# Patient Record
Sex: Male | Born: 1935 | Race: Black or African American | Hispanic: No | Marital: Single | Smoking: Never smoker
Health system: Southern US, Community
[De-identification: ages and names within clinical notes are randomized; demographics above are authoritative.]

## PROBLEM LIST (undated history)

## (undated) DIAGNOSIS — F0390 Unspecified dementia without behavioral disturbance: Secondary | ICD-10-CM

## (undated) DIAGNOSIS — E039 Hypothyroidism, unspecified: Secondary | ICD-10-CM

## (undated) DIAGNOSIS — I10 Essential (primary) hypertension: Secondary | ICD-10-CM

## (undated) DIAGNOSIS — I499 Cardiac arrhythmia, unspecified: Secondary | ICD-10-CM

---

## 2021-07-08 ENCOUNTER — Encounter (HOSPITAL_COMMUNITY): Payer: Self-pay | Admitting: Internal Medicine

## 2021-07-08 ENCOUNTER — Emergency Department (HOSPITAL_COMMUNITY): Payer: Medicare Other

## 2021-07-08 ENCOUNTER — Inpatient Hospital Stay (HOSPITAL_COMMUNITY)
Admission: EM | Admit: 2021-07-08 | Discharge: 2021-07-13 | DRG: 566 | Disposition: A | Discharge status: Skilled Nursing Facility | Payer: Medicare Other | Source: Skilled Nursing Facility | Attending: Internal Medicine | Admitting: Internal Medicine

## 2021-07-08 ENCOUNTER — Observation Stay (HOSPITAL_COMMUNITY): Payer: Medicare Other

## 2021-07-08 ENCOUNTER — Other Ambulatory Visit: Payer: Self-pay

## 2021-07-08 DIAGNOSIS — T796XXA Traumatic ischemia of muscle, initial encounter: Principal | ICD-10-CM | POA: Diagnosis present

## 2021-07-08 DIAGNOSIS — M6282 Rhabdomyolysis: Secondary | ICD-10-CM | POA: Diagnosis present

## 2021-07-08 DIAGNOSIS — E039 Hypothyroidism, unspecified: Secondary | ICD-10-CM | POA: Diagnosis present

## 2021-07-08 DIAGNOSIS — F0280 Dementia in other diseases classified elsewhere without behavioral disturbance: Secondary | ICD-10-CM | POA: Diagnosis present

## 2021-07-08 DIAGNOSIS — I10 Essential (primary) hypertension: Secondary | ICD-10-CM | POA: Diagnosis present

## 2021-07-08 DIAGNOSIS — Z66 Do not resuscitate: Secondary | ICD-10-CM | POA: Diagnosis present

## 2021-07-08 DIAGNOSIS — I480 Paroxysmal atrial fibrillation: Secondary | ICD-10-CM | POA: Diagnosis present

## 2021-07-08 DIAGNOSIS — S80821A Blister (nonthermal), right lower leg, initial encounter: Secondary | ICD-10-CM | POA: Diagnosis present

## 2021-07-08 DIAGNOSIS — E785 Hyperlipidemia, unspecified: Secondary | ICD-10-CM | POA: Diagnosis present

## 2021-07-08 DIAGNOSIS — G20 Parkinson's disease: Secondary | ICD-10-CM | POA: Diagnosis present

## 2021-07-08 DIAGNOSIS — Z7401 Bed confinement status: Secondary | ICD-10-CM

## 2021-07-08 DIAGNOSIS — Z993 Dependence on wheelchair: Secondary | ICD-10-CM

## 2021-07-08 DIAGNOSIS — R609 Edema, unspecified: Secondary | ICD-10-CM | POA: Diagnosis present

## 2021-07-08 DIAGNOSIS — Z6834 Body mass index (BMI) 34.0-34.9, adult: Secondary | ICD-10-CM

## 2021-07-08 DIAGNOSIS — W19XXXA Unspecified fall, initial encounter: Secondary | ICD-10-CM

## 2021-07-08 DIAGNOSIS — S80822A Blister (nonthermal), left lower leg, initial encounter: Secondary | ICD-10-CM | POA: Diagnosis present

## 2021-07-08 DIAGNOSIS — F0390 Unspecified dementia without behavioral disturbance: Secondary | ICD-10-CM | POA: Diagnosis present

## 2021-07-08 DIAGNOSIS — Z7982 Long term (current) use of aspirin: Secondary | ICD-10-CM

## 2021-07-08 DIAGNOSIS — Z20822 Contact with and (suspected) exposure to covid-19: Secondary | ICD-10-CM | POA: Diagnosis present

## 2021-07-08 DIAGNOSIS — Z79899 Other long term (current) drug therapy: Secondary | ICD-10-CM

## 2021-07-08 DIAGNOSIS — M160 Bilateral primary osteoarthritis of hip: Secondary | ICD-10-CM | POA: Diagnosis present

## 2021-07-08 DIAGNOSIS — W06XXXA Fall from bed, initial encounter: Secondary | ICD-10-CM | POA: Diagnosis present

## 2021-07-08 DIAGNOSIS — Z7901 Long term (current) use of anticoagulants: Secondary | ICD-10-CM

## 2021-07-08 DIAGNOSIS — G20A1 Parkinson's disease without dyskinesia, without mention of fluctuations: Secondary | ICD-10-CM | POA: Diagnosis present

## 2021-07-08 DIAGNOSIS — F32A Depression, unspecified: Secondary | ICD-10-CM | POA: Diagnosis present

## 2021-07-08 DIAGNOSIS — E669 Obesity, unspecified: Secondary | ICD-10-CM | POA: Diagnosis present

## 2021-07-08 HISTORY — DX: Unspecified dementia, unspecified severity, without behavioral disturbance, psychotic disturbance, mood disturbance, and anxiety: F03.90

## 2021-07-08 HISTORY — DX: Hypothyroidism, unspecified: E03.9

## 2021-07-08 HISTORY — DX: Essential (primary) hypertension: I10

## 2021-07-08 HISTORY — DX: Cardiac arrhythmia, unspecified: I49.9

## 2021-07-08 LAB — COMPREHENSIVE METABOLIC PANEL
ALT: 23 U/L (ref 0–44)
AST: 38 U/L (ref 15–41)
Albumin: 3.3 g/dL — ABNORMAL LOW (ref 3.5–5.0)
Alkaline Phosphatase: 80 U/L (ref 38–126)
Anion gap: 8 (ref 5–15)
BUN: 13 mg/dL (ref 8–23)
CO2: 26 mmol/L (ref 22–32)
Calcium: 9.3 mg/dL (ref 8.9–10.3)
Chloride: 102 mmol/L (ref 98–111)
Creatinine, Ser: 1.23 mg/dL (ref 0.61–1.24)
GFR, Estimated: 59 mL/min — ABNORMAL LOW (ref >60)
Glucose, Bld: 165 mg/dL — ABNORMAL HIGH (ref 70–99)
Potassium: 5 mmol/L (ref 3.5–5.1)
Sodium: 136 mmol/L (ref 135–145)
Total Bilirubin: 0.9 mg/dL (ref 0.3–1.2)
Total Protein: 7.3 g/dL (ref 6.5–8.1)

## 2021-07-08 LAB — CK
Total CK: 1902 U/L — ABNORMAL HIGH (ref 49–397)
Total CK: 5398 U/L — ABNORMAL HIGH (ref 49–397)

## 2021-07-08 LAB — CBC
HCT: 39.8 % (ref 39.0–52.0)
Hemoglobin: 12.3 g/dL — ABNORMAL LOW (ref 13.0–17.0)
MCH: 25.2 pg — ABNORMAL LOW (ref 26.0–34.0)
MCHC: 30.9 g/dL (ref 30.0–36.0)
MCV: 81.6 fL (ref 80.0–100.0)
Platelets: 158 10*3/uL (ref 150–400)
RBC: 4.88 MIL/uL (ref 4.22–5.81)
RDW: 15.1 % (ref 11.5–15.5)
WBC: 10.5 10*3/uL (ref 4.0–10.5)
nRBC: 0 % (ref 0.0–0.2)

## 2021-07-08 LAB — I-STAT CHEM 8, ED
BUN: 17 mg/dL (ref 8–23)
Calcium, Ion: 1.09 mmol/L — ABNORMAL LOW (ref 1.15–1.40)
Chloride: 100 mmol/L (ref 98–111)
Creatinine, Ser: 1.1 mg/dL (ref 0.61–1.24)
Glucose, Bld: 163 mg/dL — ABNORMAL HIGH (ref 70–99)
HCT: 40 % (ref 39.0–52.0)
Hemoglobin: 13.6 g/dL (ref 13.0–17.0)
Potassium: 5 mmol/L (ref 3.5–5.1)
Sodium: 138 mmol/L (ref 135–145)
TCO2: 28 mmol/L (ref 22–32)

## 2021-07-08 LAB — BASIC METABOLIC PANEL
Anion gap: 10 (ref 5–15)
BUN: 12 mg/dL (ref 8–23)
CO2: 26 mmol/L (ref 22–32)
Calcium: 9.1 mg/dL (ref 8.9–10.3)
Chloride: 100 mmol/L (ref 98–111)
Creatinine, Ser: 1.14 mg/dL (ref 0.61–1.24)
GFR, Estimated: >60 mL/min (ref >60)
Glucose, Bld: 137 mg/dL — ABNORMAL HIGH (ref 70–99)
Potassium: 4.3 mmol/L (ref 3.5–5.1)
Sodium: 136 mmol/L (ref 135–145)

## 2021-07-08 LAB — RESP PANEL BY RT-PCR (FLU A&B, COVID) ARPGX2
Influenza A by PCR: NEGATIVE
Influenza B by PCR: NEGATIVE
SARS Coronavirus 2 by RT PCR: NEGATIVE

## 2021-07-08 LAB — MAGNESIUM: Magnesium: 1.9 mg/dL (ref 1.7–2.4)

## 2021-07-08 MED ORDER — LACTATED RINGERS IV SOLN: INTRAVENOUS | Status: AC

## 2021-07-08 MED ORDER — FERROUS SULFATE 300 (60 FE) MG/5ML PO SYRP
330.0000 mg | ORAL_SOLUTION | Freq: Every day | ORAL | Status: DC
  Administered 2021-07-08 – 2021-07-13 (×6): 330 mg via ORAL
  Filled 2021-07-08 (×6): qty 10

## 2021-07-08 MED ORDER — PREDNISOLONE ACETATE 1 % OP SUSP
1.0000 [drp] | Freq: Two times a day (BID) | OPHTHALMIC | Status: DC
  Administered 2021-07-09 – 2021-07-13 (×10): 1 [drp] via OPHTHALMIC
  Filled 2021-07-08: qty 5

## 2021-07-08 MED ORDER — ACETAMINOPHEN 650 MG RE SUPP: 650.0000 mg | Freq: Four times a day (QID) | RECTAL | Status: DC | PRN

## 2021-07-08 MED ORDER — SENNOSIDES-DOCUSATE SODIUM 8.6-50 MG PO TABS: 1.0000 | ORAL_TABLET | Freq: Every evening | ORAL | Status: DC | PRN

## 2021-07-08 MED ORDER — ACETAMINOPHEN 325 MG PO TABS: 650.0000 mg | ORAL_TABLET | Freq: Four times a day (QID) | ORAL | Status: DC | PRN

## 2021-07-08 MED ORDER — ISOSORBIDE MONONITRATE 20 MG PO TABS
10.0000 mg | ORAL_TABLET | Freq: Every day | ORAL | Status: DC
  Administered 2021-07-09 – 2021-07-13 (×5): 10 mg via ORAL
  Filled 2021-07-08 (×5): qty 1

## 2021-07-08 MED ORDER — ESCITALOPRAM OXALATE 10 MG PO TABS
10.0000 mg | ORAL_TABLET | Freq: Every day | ORAL | Status: DC
  Administered 2021-07-09 – 2021-07-13 (×5): 10 mg via ORAL
  Filled 2021-07-08 (×5): qty 1

## 2021-07-08 MED ORDER — LACTATED RINGERS IV BOLUS
250.0000 mL | Freq: Once | INTRAVENOUS | Status: AC
  Administered 2021-07-08: 250 mL via INTRAVENOUS

## 2021-07-08 MED ORDER — OMEGA-3-ACID ETHYL ESTERS 1 G PO CAPS
1.0000 g | ORAL_CAPSULE | Freq: Every day | ORAL | Status: DC
  Administered 2021-07-08 – 2021-07-13 (×6): 1 g via ORAL
  Filled 2021-07-08 (×6): qty 1

## 2021-07-08 MED ORDER — AMANTADINE HCL 100 MG PO CAPS
100.0000 mg | ORAL_CAPSULE | Freq: Two times a day (BID) | ORAL | Status: DC
  Administered 2021-07-08 – 2021-07-13 (×10): 100 mg via ORAL
  Filled 2021-07-08 (×11): qty 1

## 2021-07-08 MED ORDER — DOCUSATE SODIUM 100 MG PO CAPS
100.0000 mg | ORAL_CAPSULE | Freq: Every day | ORAL | Status: DC
  Administered 2021-07-08 – 2021-07-12 (×5): 100 mg via ORAL
  Filled 2021-07-08 (×5): qty 1

## 2021-07-08 MED ORDER — SODIUM CHLORIDE 0.9 % IV BOLUS
1000.0000 mL | Freq: Once | INTRAVENOUS | Status: AC
  Administered 2021-07-08: 1000 mL via INTRAVENOUS

## 2021-07-08 MED ORDER — APIXABAN 5 MG PO TABS
5.0000 mg | ORAL_TABLET | Freq: Two times a day (BID) | ORAL | Status: DC
  Administered 2021-07-08 – 2021-07-13 (×10): 5 mg via ORAL
  Filled 2021-07-08 (×10): qty 1

## 2021-07-08 MED ORDER — LEVOTHYROXINE SODIUM 88 MCG PO TABS
88.0000 ug | ORAL_TABLET | Freq: Every day | ORAL | Status: DC
  Administered 2021-07-09 – 2021-07-13 (×5): 88 ug via ORAL
  Filled 2021-07-08 (×6): qty 1

## 2021-07-08 MED ORDER — NITROGLYCERIN 0.4 MG SL SUBL: 0.4000 mg | SUBLINGUAL_TABLET | SUBLINGUAL | Status: DC | PRN

## 2021-07-08 MED ORDER — CARBIDOPA-LEVODOPA 25-100 MG PO TABS
1.0000 | ORAL_TABLET | Freq: Three times a day (TID) | ORAL | Status: DC
  Administered 2021-07-08 – 2021-07-13 (×15): 1 via ORAL
  Filled 2021-07-08 (×15): qty 1

## 2021-07-08 NOTE — ED Provider Notes (Signed)
Care assumed from Dr. Durwin Nora.  At time of transfer care, patient is awaiting for urinalysis to be completed prior to admission for unwitnessed fall, unknown downtime, unresponsiveness, altered mental status, and rhabdo.  Per previous team, patient will need admission for rhabdo and rehydration.  COVID and flu are negative.  CT of the head showed no acute intracranial normality.  Pelvis and hip imaging showed no acute fractures patient was given a small amount of fluids initially due to the rhabdo but also peripheral edema being present.  Plan of care will be to admit when work-up is completed.  As the patient has been here for  multiple hours waiting evaluation, CK and metabolic panel will be trended to make sure it is not worsening.  7:00 PM Patient CK has gone from 1900 to 5300.  As with previous plan, will order more fluids and call for admission for altered mental status and rhabdo.       Danny Woodard, Canary Brim, MD 07/09/21 [phone]
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
## 2021-07-08 NOTE — H&P (Signed)
History and Physical    Danny Woodard IWL:[id number] DOB: [birth year] DOA: 07/08/2021  PCP: Pcp, No  Patient coming from: Assisted living facility.  History obtained from patient's wife.  Patient has dementia.  Chief Complaint: Unwitnessed fall.  HPI: Danny Woodard is a 85 y.o. male with history of Parkinson's disease and dementia and largely bedbound for the last year was found on the floor this morning after an unwitnessed fall.  Was not known how long patient was on the floor.  Patient was brought to the ER.  Per wife patient has not had any complaints recently.  ED Course: In the ER patient appears nonfocal but has significant pain on moving the lower extremity particular the right 1 and also has blisters on the lower extremities.  Per wife these blisters are new.  Patient's labs show a CK level of 1900 which has further worsened to 5300.  Patient was given fluid bolus admitted for rhabdomyolysis.  EKG shows sinus tachycardia.  COVID test was negative.  X-ray pelvis was unremarkable.  Review of Systems: As per HPI, rest all negative.   Past Medical History:  Diagnosis Date   Dementia (HCC)    Dysrhythmia    Hypertension    Hypothyroidism     History reviewed. No pertinent surgical history.   reports that he has never smoked. He has never used smokeless tobacco. He reports that he does not currently use alcohol. He reports that he does not use drugs.  No Known Allergies  Family History  Problem Relation Age of Onset   Dementia Sister     Prior to Admission medications   Medication Sig Start Date End Date Taking? Authorizing Provider  acetaminophen (TYLENOL) 500 MG tablet Take 1,000 mg by mouth every 6 (six) hours as needed for mild pain or fever.   Yes [provider]  alum & mag hydroxide-simeth (MAALOX/MYLANTA) 200-200-20 MG/5ML suspension Take 30 mLs by mouth every 6 (six) hours as needed for indigestion or heartburn.   Yes [provider]   amantadine (SYMMETREL) 100 MG capsule Take 100 mg by mouth 2 (two) times daily. 06/25/21  Yes [provider]  ASPIRIN LOW DOSE 81 MG chewable tablet Chew 81 mg by mouth daily. 06/25/21  Yes [provider]  carbidopa-levodopa (SINEMET IR) 25-100 MG tablet Take 1 tablet by mouth 3 (three) times daily. 06/25/21  Yes [provider]  Cholecalciferol (VITAMIN D-3) 125 MCG (5000 UT) TABS Take 125 mcg by mouth daily.   Yes [provider]  Cyanocobalamin (VITAMIN B12) 1000 MCG TBCR Take 1,000 mg by mouth daily.   Yes [provider]  docusate sodium (COLACE) 100 MG capsule Take 100 mg by mouth at bedtime.   Yes [provider]  ELIQUIS 5 MG TABS tablet Take 5 mg by mouth 2 (two) times daily. 06/25/21  Yes [provider]  escitalopram (LEXAPRO) 10 MG tablet Take 10 mg by mouth daily. 06/25/21  Yes [provider]  ferrous sulfate 220 (44 Fe) MG/5ML solution Take 330 mg by mouth daily.   Yes [provider]  isosorbide mononitrate (ISMO) 10 MG tablet Take 10 mg by mouth daily. 06/25/21  Yes [provider]  levothyroxine (SYNTHROID) 88 MCG tablet Take 88 mcg by mouth daily. 06/25/21  Yes [provider]  Menthol-Zinc Oxide (CALMOSEPTINE) 0.44-20.6 % OINT Apply 1 application topically as needed (to buttocks).   Yes [provider]  nitroGLYCERIN (NITROSTAT) 0.4 MG SL tablet Place 0.4 mg  under the tongue every 5 (five) minutes as needed for chest pain. 06/25/21  Yes [provider]  nystatin (MYCOSTATIN/NYSTOP) powder Apply 1 application topically at bedtime. And as needed for rash/irritation 06/25/21  Yes [provider]  omega-3 acid ethyl esters (LOVAZA) 1 g capsule Take 1 g by mouth daily. 06/25/21  Yes [provider]  potassium chloride (KLOR-CON) 20 MEQ packet Take 20 mEq by mouth daily. 06/25/21  Yes [provider]  prednisoLONE acetate (PRED FORTE) 1 % ophthalmic suspension  Place 1 drop into both eyes in the morning and at bedtime. 06/25/21  Yes [provider]  rosuvastatin (CRESTOR) 40 MG tablet Take 40 mg by mouth at bedtime. 06/25/21  Yes [provider]  senna-docusate (SENOKOT-S) 8.6-50 MG tablet Take 1 tablet by mouth at bedtime as needed for mild constipation.   Yes [provider]    Physical Exam: Constitutional: Moderately built and nourished. Vitals:   07/08/21 1900 07/08/21 1915 07/08/21 1937 07/08/21 1938  BP: 107/76 130/63  (!) 119/59  Pulse: 92 92  96  Resp: (!) 22 (!) 24  20  Temp:    99.2 F (37.3 C)  TempSrc:    Oral  SpO2: 96% 95%    Weight:   106.1 kg   Height:   6\' 4"  (1.93 m)    Eyes: Anicteric no pallor. ENMT: No discharge from the ears eyes nose and mouth. Neck: No mass felt.  No neck rigidity. Respiratory: No rhonchi or crepitations. Cardiovascular: S1-S2 heard. Abdomen: Soft nontender bowel sound present. Musculoskeletal: Swelling of the both lower extremities.  Has more pain on the right lower extremity when he moves. Skin: Blisters on both lower extremities.  More on the right side. Neurologic: Alert awake oriented to his name.  Moving all extremities but lower extremities restricted with pain. Psychiatric: Has dementia.   Labs on Admission: I have personally reviewed following labs and imaging studies  CBC: Recent Labs  Lab 07/08/21 0835 07/08/21 0915  WBC 10.5  --   HGB 12.3* 13.6  HCT 39.8 40.0  MCV 81.6  --   PLT 158  --    Basic Metabolic Panel: Recent Labs  Lab 07/08/21 0835 07/08/21 0915 07/08/21 1556  NA 136 138 136  K 5.0 5.0 4.3  CL 102 100 100  CO2 26  --  26  GLUCOSE 165* 163* 137*  BUN 13 17 12   CREATININE 1.23 1.10 1.14  CALCIUM 9.3  --  9.1  MG 1.9  --   --    GFR: Estimated Creatinine Clearance: 66.8 mL/min (by C-G formula based on SCr of 1.14 mg/dL). Liver Function Tests: Recent Labs  Lab 07/08/21 0835  AST 38  ALT 23  ALKPHOS 80  BILITOT 0.9  PROT  7.3  ALBUMIN 3.3*   No results for input(s): LIPASE, AMYLASE in the last 168 hours. No results for input(s): AMMONIA in the last 168 hours. Coagulation Profile: No results for input(s): INR, PROTIME in the last 168 hours. Cardiac Enzymes: Recent Labs  Lab 07/08/21 0835 07/08/21 1556  CKTOTAL 1,902* 5,398*   BNP (last 3 results) No results for input(s): PROBNP in the last 8760 hours. HbA1C: No results for input(s): HGBA1C in the last 72 hours. CBG: No results for input(s): GLUCAP in the last 168 hours. Lipid Profile: No results for input(s): CHOL, HDL, LDLCALC, TRIG, CHOLHDL, LDLDIRECT in the last 72 hours. Thyroid Function Tests: No results for input(s): TSH, T4TOTAL, FREET4, T3FREE, THYROIDAB in the  last 72 hours. Anemia Panel: No results for input(s): VITAMINB12, FOLATE, FERRITIN, TIBC, IRON, RETICCTPCT in the last 72 hours. Urine analysis: No results found for: COLORURINE, APPEARANCEUR, LABSPEC, PHURINE, GLUCOSEU, HGBUR, BILIRUBINUR, KETONESUR, PROTEINUR, UROBILINOGEN, NITRITE, LEUKOCYTESUR Sepsis Labs: @LABRCNTIP (procalcitonin:4,lacticidven:4) ) Recent Results (from the past 240 hour(s))  Resp Panel by RT-PCR (Flu A&B, Covid) Nasopharyngeal Swab     Status: None   Collection Time: 07/08/21  9:21 AM   Specimen: Nasopharyngeal Swab; Nasopharyngeal(NP) swabs in vial transport medium  Result Value Ref Range Status   SARS Coronavirus 2 by RT PCR NEGATIVE NEGATIVE Final    Comment: (NOTE) SARS-CoV-2 target nucleic acids are NOT DETECTED.  The SARS-CoV-2 RNA is generally detectable in upper respiratory specimens during the acute phase of infection. The lowest concentration of SARS-CoV-2 viral copies this assay can detect is 138 copies/mL. A negative result does not preclude SARS-Cov-2 infection and should not be used as the sole basis for treatment or other patient management decisions. A negative result may occur with  improper specimen collection/handling, submission of  specimen other than nasopharyngeal swab, presence of viral mutation(s) within the areas targeted by this assay, and inadequate number of viral copies(<138 copies/mL). A negative result must be combined with clinical observations, patient history, and epidemiological information. The expected result is Negative.  Fact Sheet for Patients:  07/10/21  Fact Sheet for Healthcare Providers:  BloggerCourse.com  This test is no t yet approved or cleared by the [employer] FDA and  has been authorized for detection and/or diagnosis of SARS-CoV-2 by FDA under an Emergency Use Authorization (EUA). This EUA will remain  in effect (meaning this test can be used) for the duration of the COVID-19 declaration under Section 564(b)(1) of the Act, 21 U.S.C.section 360bbb-3(b)(1), unless the authorization is terminated  or revoked sooner.       Influenza A by PCR NEGATIVE NEGATIVE Final   Influenza B by PCR NEGATIVE NEGATIVE Final    Comment: (NOTE) The Xpert Xpress SARS-CoV-2/FLU/RSV plus assay is intended as an aid in the diagnosis of influenza from Nasopharyngeal swab specimens and should not be used as a sole basis for treatment. Nasal washings and aspirates are unacceptable for Xpert Xpress SARS-CoV-2/FLU/RSV testing.  Fact Sheet for Patients: [place]  Fact Sheet for Healthcare Providers: BloggerCourse.com  This test is not yet approved or cleared by the [employer] FDA and has been authorized for detection and/or diagnosis of SARS-CoV-2 by FDA under an Emergency Use Authorization (EUA). This EUA will remain in effect (meaning this test can be used) for the duration of the COVID-19 declaration under Section 564(b)(1) of the Act, 21 U.S.C. section 360bbb-3(b)(1), unless the authorization is terminated or revoked.  Performed at [facility], [address],  [place], [place]      Radiological Exams on Admission: CT HEAD WO CONTRAST (41287)  Result Date: 07/08/2021 CLINICAL DATA:  Trauma EXAM: CT HEAD WITHOUT CONTRAST TECHNIQUE: Contiguous axial images were obtained from the base of the skull through the vertex without intravenous contrast. COMPARISON:  None. FINDINGS: Brain: There is no evidence of acute intracranial hemorrhage, extra-axial fluid collection, or infarct. There is encephalomalacia in the left occipital lobe and right parietal lobe consistent with remote infarcts. There are remote lacunar infarcts in the bilateral basal ganglia. Additional hypodensities throughout the subcortical and periventricular white matter likely reflects sequela of chronic white matter microangiopathy. There is mild global parenchymal volume loss with ex vacuo enlargement of the ventricular system. No mass lesion is  identified. There is no midline shift. Vascular: There is calcification of the bilateral cavernous ICAs and vertebral arteries. Skull: Normal. Negative for fracture or focal lesion. Sinuses/Orbits: There is mild mucosal thickening along the floor the right maxillary sinus. The remaining paranasal sinuses are clear. Bilateral lens implants are in place. The globes and orbits are otherwise unremarkable. Other: None. IMPRESSION: 1. No acute intracranial hemorrhage or calvarial fracture. 2. 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 Electronically Signed   By: Lesia Hausen M.D.   On: 07/08/2021 10:23   DG Hip Unilat W or Wo Pelvis 2-3 Views Left  Result Date: 07/08/2021 CLINICAL DATA:  Larey Seat 2 days ago. EXAM: DG HIP (WITH OR WITHOUT PELVIS) 2-3V RIGHT; DG HIP (WITH OR WITHOUT PELVIS) 2-3V LEFT COMPARISON:  None. FINDINGS: No acute fracture or dislocation. Mild bilateral hip osteoarthritis. IMPRESSION: 1. No acute osseous abnormality. Electronically Signed   By:  Obie Dredge M.D.   On: 07/08/2021 10:07   DG Hip Unilat W or Wo Pelvis 2-3 Views Right  Result Date: 07/08/2021 CLINICAL DATA:  Larey Seat 2 days ago. EXAM: DG HIP (WITH OR WITHOUT PELVIS) 2-3V RIGHT; DG HIP (WITH OR WITHOUT PELVIS) 2-3V LEFT COMPARISON:  None. FINDINGS: No acute fracture or dislocation. Mild bilateral hip osteoarthritis. IMPRESSION: 1. No acute osseous abnormality. Electronically Signed   By: Obie Dredge M.D.   On: 07/08/2021 10:07    EKG: Independently reviewed.  Sinus tachycardia RBBB.  Assessment/Plan Principal Problem:   Rhabdomyolysis Active Problems:   PAF (paroxysmal atrial fibrillation) (HCC)   Parkinson's disease (HCC)   Dementia (HCC)   Hypothyroidism    Rhabdomyolysis secondary to fall for which patient is receiving fluids.  Since patient has significant pain on moving his lower extremity particular the right 1 I have ordered a CT of the right femur.  We will continue to monitor CK levels.  Follow intake output. Blisters on the lower extremities more on the right side.  Cause not clear.  We will closely monitor.  Wound team consult. A. fib presently slightly tachycardic.  On Eliquis.  At home patient is not on any rate limiting medications. History of Parkinson's disease on carbidopa and amantadine. History of depression on Lexapro. Hypertension on isosorbide. Hyperlipidemia on statins holding secondary to rhabdomyolysis. Hypothyroidism on Synthroid.  Check TSH. Dementia presently not on medications.   DVT prophylaxis: Apixaban. Code Status: DNR confirmed with patient's wife. Family Communication: Patient's wife. Disposition Plan: Back to facility when stable. Consults called: Wound team.  Physical therapy. Admission status: Observation.   Eduard Clos MD [facility] Pager [phone].  If 7PM-7AM, please contact night-coverage [email or web address] Password [facility]  07/08/2021, 9:14 PM

## 2021-07-08 NOTE — ED Triage Notes (Signed)
Pt arrived to ED via [facility] from [facility] w/ c/o unwitnessed fall after rolling out of bed. Pt c/o L hip pain 7/10. No deformities noted by PTAR noted fluid filled blisters and BLE edema.  PTAR VS: 140/102, HR 103, 91% RA (PTAR didn't apply oxygen)

## 2021-07-09 DIAGNOSIS — S80822A Blister (nonthermal), left lower leg, initial encounter: Secondary | ICD-10-CM | POA: Diagnosis present

## 2021-07-09 DIAGNOSIS — I10 Essential (primary) hypertension: Secondary | ICD-10-CM | POA: Diagnosis present

## 2021-07-09 DIAGNOSIS — Z6834 Body mass index (BMI) 34.0-34.9, adult: Secondary | ICD-10-CM | POA: Diagnosis not present

## 2021-07-09 DIAGNOSIS — I480 Paroxysmal atrial fibrillation: Secondary | ICD-10-CM | POA: Diagnosis present

## 2021-07-09 DIAGNOSIS — E785 Hyperlipidemia, unspecified: Secondary | ICD-10-CM | POA: Diagnosis present

## 2021-07-09 DIAGNOSIS — E669 Obesity, unspecified: Secondary | ICD-10-CM | POA: Diagnosis present

## 2021-07-09 DIAGNOSIS — W06XXXA Fall from bed, initial encounter: Secondary | ICD-10-CM | POA: Diagnosis present

## 2021-07-09 DIAGNOSIS — E039 Hypothyroidism, unspecified: Secondary | ICD-10-CM

## 2021-07-09 DIAGNOSIS — S80821A Blister (nonthermal), right lower leg, initial encounter: Secondary | ICD-10-CM | POA: Diagnosis present

## 2021-07-09 DIAGNOSIS — T796XXA Traumatic ischemia of muscle, initial encounter: Secondary | ICD-10-CM | POA: Diagnosis present

## 2021-07-09 DIAGNOSIS — G309 Alzheimer's disease, unspecified: Secondary | ICD-10-CM

## 2021-07-09 DIAGNOSIS — Z79899 Other long term (current) drug therapy: Secondary | ICD-10-CM | POA: Diagnosis not present

## 2021-07-09 DIAGNOSIS — Z993 Dependence on wheelchair: Secondary | ICD-10-CM | POA: Diagnosis not present

## 2021-07-09 DIAGNOSIS — R609 Edema, unspecified: Secondary | ICD-10-CM | POA: Diagnosis present

## 2021-07-09 DIAGNOSIS — Z7401 Bed confinement status: Secondary | ICD-10-CM | POA: Diagnosis not present

## 2021-07-09 DIAGNOSIS — F0280 Dementia in other diseases classified elsewhere without behavioral disturbance: Secondary | ICD-10-CM

## 2021-07-09 DIAGNOSIS — Z7982 Long term (current) use of aspirin: Secondary | ICD-10-CM | POA: Diagnosis not present

## 2021-07-09 DIAGNOSIS — Z20822 Contact with and (suspected) exposure to covid-19: Secondary | ICD-10-CM | POA: Diagnosis present

## 2021-07-09 DIAGNOSIS — Z66 Do not resuscitate: Secondary | ICD-10-CM | POA: Diagnosis present

## 2021-07-09 DIAGNOSIS — M6282 Rhabdomyolysis: Secondary | ICD-10-CM | POA: Diagnosis present

## 2021-07-09 DIAGNOSIS — W19XXXA Unspecified fall, initial encounter: Secondary | ICD-10-CM | POA: Diagnosis not present

## 2021-07-09 DIAGNOSIS — F32A Depression, unspecified: Secondary | ICD-10-CM | POA: Diagnosis present

## 2021-07-09 DIAGNOSIS — M160 Bilateral primary osteoarthritis of hip: Secondary | ICD-10-CM | POA: Diagnosis present

## 2021-07-09 DIAGNOSIS — Z7901 Long term (current) use of anticoagulants: Secondary | ICD-10-CM | POA: Diagnosis not present

## 2021-07-09 DIAGNOSIS — G20 Parkinson's disease: Secondary | ICD-10-CM | POA: Diagnosis present

## 2021-07-09 LAB — HEPATIC FUNCTION PANEL
ALT: 29 U/L (ref 0–44)
AST: 96 U/L — ABNORMAL HIGH (ref 15–41)
Albumin: 3 g/dL — ABNORMAL LOW (ref 3.5–5.0)
Alkaline Phosphatase: 75 U/L (ref 38–126)
Bilirubin, Direct: 0.2 mg/dL (ref 0.0–0.2)
Indirect Bilirubin: 0.8 mg/dL (ref 0.3–0.9)
Total Bilirubin: 1 mg/dL (ref 0.3–1.2)
Total Protein: 6.6 g/dL (ref 6.5–8.1)

## 2021-07-09 LAB — BASIC METABOLIC PANEL
Anion gap: 11 (ref 5–15)
BUN: 12 mg/dL (ref 8–23)
CO2: 23 mmol/L (ref 22–32)
Calcium: 8.7 mg/dL — ABNORMAL LOW (ref 8.9–10.3)
Chloride: 102 mmol/L (ref 98–111)
Creatinine, Ser: 1.04 mg/dL (ref 0.61–1.24)
GFR, Estimated: >60 mL/min (ref >60)
Glucose, Bld: 115 mg/dL — ABNORMAL HIGH (ref 70–99)
Potassium: 4.3 mmol/L (ref 3.5–5.1)
Sodium: 136 mmol/L (ref 135–145)

## 2021-07-09 LAB — CBC
HCT: 37.1 % — ABNORMAL LOW (ref 39.0–52.0)
Hemoglobin: 11.5 g/dL — ABNORMAL LOW (ref 13.0–17.0)
MCH: 25.7 pg — ABNORMAL LOW (ref 26.0–34.0)
MCHC: 31 g/dL (ref 30.0–36.0)
MCV: 83 fL (ref 80.0–100.0)
Platelets: 135 10*3/uL — ABNORMAL LOW (ref 150–400)
RBC: 4.47 MIL/uL (ref 4.22–5.81)
RDW: 15.3 % (ref 11.5–15.5)
WBC: 8.9 10*3/uL (ref 4.0–10.5)
nRBC: 0.2 % (ref 0.0–0.2)

## 2021-07-09 LAB — CK: Total CK: 6095 U/L — ABNORMAL HIGH (ref 49–397)

## 2021-07-09 NOTE — ED Notes (Addendum)
Attempted to give to floor [phone]

## 2021-07-09 NOTE — Progress Notes (Signed)
Physical Therapy Evaluation Patient Details Name: Danny Woodard MRN: [id number] DOB: 12-31-1935 Today's Date: 07/09/2021   History of Present Illness  Pt is a 84yo male presenting to  [facility] ED on 8/18 from facility s/p fall out of bed. Pt down for approximately 1 hour and labwork suggestive of rhabdomyolysis. Imaging negative for acute findings. PMH: dementia, Parkinson's Disease, PAF, hypothyroidism, HTN.  Clinical Impression  Pt presents with the impairments above and problems listed below. Pt was total assist +2 for rolling in bed multiple times for cleanup following BM. Pt demonstrating high level of rigidity and had difficulty with basic bed mobility tasks. Further mobility unsafe to attempt on stretcher. Recommending SNF-level therapies upon discharge. We will continue to follow him acutely to promote independence with functional mobility.    Follow Up Recommendations SNF;Supervision/Assistance - 24 hour    Equipment Recommendations  None recommended by PT (Defer to next level of care)    Recommendations for Other Services       Precautions / Restrictions Precautions Precautions: Fall Precaution Comments: Larey Seat out of bed at facility. Restrictions Weight Bearing Restrictions: No      Mobility  Bed Mobility Overal bed mobility: Needs Assistance Bed Mobility: Rolling Rolling: +2 for physical assistance;Total assist         General bed mobility comments: Mobility limited to bed mobility as pt with BM upon entry. Pt required total assist +2 for rolling in bed multiple times for clean up; noted increased rigidity and weakness. Able to assist very minimally with UEs. Further mobility unsafe to attempt on stretcher    Transfers                    Ambulation/Gait                Stairs            Wheelchair Mobility    Modified Rankin (Stroke Patients Only)       Balance                                             Pertinent  Vitals/Pain Pain Assessment: 0-10 Pain Score: 9  Pain Location: right hip Pain Descriptors / Indicators: Grimacing;Guarding Pain Intervention(s): Monitored during session;Repositioned    Home Living Family/patient expects to be discharged to:: Other (Comment) ([facility]: likely [facility] care)                      Prior Function Level of Independence: Needs assistance   Gait / Transfers Assistance Needed: per chart, pt transfers from bed to [facility], but assume pt requires assist.  ADL's / Homemaking Assistance Needed: Unsure of baseline, but anticipate pt requires assist for all ADLs.        Hand Dominance        Extremity/Trunk Assessment   Upper Extremity Assessment Upper Extremity Assessment: Generalized weakness    Lower Extremity Assessment Lower Extremity Assessment: Generalized weakness;RLE deficits/detail RLE Deficits / Details: Large blisters noted throughout RLE       Communication   Communication: HOH  Cognition Arousal/Alertness: Awake/alert Behavior During Therapy: Flat affect Overall Cognitive Status: History of cognitive impairments - at baseline  General Comments: Pt alert to self only, able to respond to simple questions and commands.      General Comments General comments (skin integrity, edema, etc.): Noted blistering of skin on LEs with edematous nature of BLE. Right LE warmer to touch than left.    Exercises     Assessment/Plan    PT Assessment Patient needs continued PT services  PT Problem List Decreased strength;Decreased range of motion;Decreased activity tolerance;Decreased balance;Decreased mobility;Decreased coordination;Decreased cognition;Decreased safety awareness;Pain;Decreased knowledge of precautions;Decreased knowledge of use of DME       PT Treatment Interventions DME instruction;Functional mobility training;Therapeutic activities;Therapeutic exercise;Balance  training;Neuromuscular re-education;Cognitive remediation;Patient/family education;Wheelchair mobility training    PT Goals (Current goals can be found in the Care Plan section)  Acute Rehab PT Goals PT Goal Formulation: Patient unable to participate in goal setting Time For Goal Achievement: 07/23/21 Potential to Achieve Goals: Fair    Frequency Min 2X/week   Barriers to discharge        Co-evaluation               AM-PAC PT "6 Clicks" Mobility  Outcome Measure Help needed turning from your back to your side while in a flat bed without using bedrails?: Total Help needed moving from lying on your back to sitting on the side of a flat bed without using bedrails?: Total Help needed moving to and from a bed to a chair (including a wheelchair)?: Total Help needed standing up from a chair using your arms (e.g., wheelchair or bedside chair)?: Total Help needed to walk in hospital room?: Total Help needed climbing 3-5 steps with a railing? : Total 6 Click Score: 6    End of Session   Activity Tolerance: Patient tolerated treatment well Patient left: in bed;with call bell/phone within reach (on stretcher in ED) Nurse Communication: Mobility status;Other (comment) (Pt had BM) PT Visit Diagnosis: Other symptoms and signs involving the nervous system (R29.898);Muscle weakness (generalized) (M62.81);History of falling (Z91.81)    Time: 9539-6728 PT Time Calculation (min) (ACUTE ONLY): 28 min   Charges:   PT Evaluation $PT Eval Moderate Complexity: 1 Mod PT Treatments $Therapeutic Activity: 8-22 mins       Johnn Hai, SPT Johnn Hai 07/09/2021, 2:30 PM

## 2021-07-09 NOTE — Progress Notes (Addendum)
PROGRESS NOTE        PATIENT DETAILS Name: Danny Woodard Age: 85 y.o. Sex: male Date of Birth: 07-29-1936 Admit Date: 07/08/2021 Admitting Physician Eduard Clos, MD PCP:Pcp, No  Brief Narrative: Patient is a 85 y.o. male with history of Parkinson's disease, dementia, mostly bed to wheelchair bound (able to transfer) who sustained a mechanical fall (while transferring from wheelchair to bed) and was found on the floor by his nursing facility staff.  Per patient report he was on the floor for more than an hour before he was found.  He was subsequently admitted to the hospitalist service.  Significant events: 8/18>> admit for mechanical fall-rhabdomyolysis.  Significant studies: 8/18>> CT head: No acute intracranial abnormalities 8/18>> x-ray right/left hip: No acute osseous abnormality. 8/18>> CT right femur: No fracture.  Antimicrobial therapy: None  Microbiology data: 8/18>> COVID/influenza PCR: Negative  Procedures : None  Consults: None  DVT Prophylaxis : apixaban (ELIQUIS) tablet 5 mg    Subjective: Lying comfortably in bed-slow to respond but does respond appropriately to simple questions.  Speech slow but clear.  Claims he was on the floor for at least an hour before he was found by nursing staff.  Claims that he fell when he was transferring from wheelchair to bed.  Denies loss of consciousness.   Assessment/Plan: Rhabdomyolysis: Due to fall/being on the floor for more than an hour.  CK levels increasing this morning.  Continue IVF-recheck CK levels tomorrow.   Mechanical fall: Mostly bed to wheelchair bound-but able to transfer-minimally ambulate.  CT imaging negative for fractures.  Await PT/OT eval to determine safe disposition.  Suspect this fall is due to frailty/chronic debility/Parkinson's disease associated autonomic dysfunction.  PAF: Maintaining sinus rhythm-continue Eliquis.  HTN: Stable-continue  Imdur  Parkinson's disease: Continue carbidopa/levodopa/amantadine.  Hypothyroidism: Continue Synthroid  Dementia: Unclear whether this is Alzheimer's dementia or dementia related to Parkinson's disease.  Seems minimally confused-family not present-not clear whether he is at baseline.  Depression: Stable-continue Lexapro  Right lower extremity blisters: Per patient-this occurred after he fell-suspect this is due to edema from falling and lying mostly on his right side.  Wound care consult appreciated.  Diet: Diet Order             Diet Heart Room service appropriate? Yes; Fluid consistency: Thin  Diet effective now                    Code Status: DNR  Family Communication: [phone] left a voicemail on 8/19  Disposition Plan: Status is: Observation  The patient will require care spanning > 2 midnights and should be moved to inpatient because: Inpatient level of care appropriate due to severity of illness  Dispo: The patient is from: ALF              Anticipated d/c is to: SNF vs back to ALF              Patient currently is not medically stable to d/c.   Difficult to place patient No    Barriers to Discharge: Worsening rhabdo with increasing CK levels-continue IV fluids.  Await PT evaluation to ensure he is safe to go back to ALF.  Antimicrobial agents: Anti-infectives (From admission, onward)    None        Time spent: 35 minutes-Greater than 50% of this time  was spent in counseling, explanation of diagnosis, planning of further management, and coordination of care.  MEDICATIONS: Scheduled Meds:  amantadine  100 mg Oral BID   apixaban  5 mg Oral BID   carbidopa-levodopa  1 tablet Oral TID   docusate sodium  100 mg Oral QHS   escitalopram  10 mg Oral Daily   ferrous sulfate  330 mg Oral Daily   isosorbide mononitrate  10 mg Oral Daily   levothyroxine  88 mcg Oral Daily   omega-3 acid ethyl esters  1 g Oral Daily   prednisoLONE acetate   1 drop Both Eyes BID   Continuous Infusions:  lactated ringers 100 mL/hr at 07/08/21 2247   PRN Meds:.acetaminophen **OR** acetaminophen, nitroGLYCERIN, senna-docusate   PHYSICAL EXAM: Vital signs: Vitals:   07/09/21 0715 07/09/21 0730 07/09/21 0745 07/09/21 0800  BP: (!) 141/87 (!) 147/89 (!) 155/95 (!) 145/86  Pulse: 93 81 (!) 157 90  Resp: 18 (!) 21 19 17   Temp:      TempSrc:      SpO2: 93% 98% 92% 100%  Weight:      Height:       Filed Weights   07/08/21 1937  Weight: 106.1 kg   Body mass index is 28.48 kg/m.   Gen Exam:Alert awake-not in any distress HEENT:atraumatic, normocephalic Chest: B/L clear to auscultation anteriorly CVS:S1S2 regular Abdomen:soft non tender, non distended Extremities: Mild swelling of right lower extremity-some blisters present. Neurology: Non focal-but has generalized weakness. Skin: no rash  I have personally reviewed following labs and imaging studies  LABORATORY DATA: CBC: Recent Labs  Lab 07/08/21 0835 07/08/21 0915 07/09/21 0219  WBC 10.5  --  8.9  HGB 12.3* 13.6 11.5*  HCT 39.8 40.0 37.1*  MCV 81.6  --  83.0  PLT 158  --  135*    Basic Metabolic Panel: Recent Labs  Lab 07/08/21 0835 07/08/21 0915 07/08/21 1556 07/09/21 0219  NA 136 138 136 136  K 5.0 5.0 4.3 4.3  CL 102 100 100 102  CO2 26  --  26 23  GLUCOSE 165* 163* 137* 115*  BUN 13 17 12 12   CREATININE 1.23 1.10 1.14 1.04  CALCIUM 9.3  --  9.1 8.7*  MG 1.9  --   --   --     GFR: Estimated Creatinine Clearance: 73.2 mL/min (by C-G formula based on SCr of 1.04 mg/dL).  Liver Function Tests: Recent Labs  Lab 07/08/21 0835 07/09/21 0219  AST 38 96*  ALT 23 29  ALKPHOS 80 75  BILITOT 0.9 1.0  PROT 7.3 6.6  ALBUMIN 3.3* 3.0*   No results for input(s): LIPASE, AMYLASE in the last 168 hours. No results for input(s): AMMONIA in the last 168 hours.  Coagulation Profile: No results for input(s): INR, PROTIME in the last 168 hours.  Cardiac  Enzymes: Recent Labs  Lab 07/08/21 0835 07/08/21 1556 07/09/21 0219  CKTOTAL 1,902* 5,398* 6,095*    BNP (last 3 results) No results for input(s): PROBNP in the last 8760 hours.  Lipid Profile: No results for input(s): CHOL, HDL, LDLCALC, TRIG, CHOLHDL, LDLDIRECT in the last 72 hours.  Thyroid Function Tests: No results for input(s): TSH, T4TOTAL, FREET4, T3FREE, THYROIDAB in the last 72 hours.  Anemia Panel: No results for input(s): VITAMINB12, FOLATE, FERRITIN, TIBC, IRON, RETICCTPCT in the last 72 hours.  Urine analysis: No results found for: COLORURINE, APPEARANCEUR, LABSPEC, PHURINE, GLUCOSEU, HGBUR, BILIRUBINUR, KETONESUR, PROTEINUR, UROBILINOGEN, NITRITE, LEUKOCYTESUR  Sepsis Labs: Lactic  Acid, Venous No results found for: LATICACIDVEN  MICROBIOLOGY: Recent Results (from the past 240 hour(s))  Resp Panel by RT-PCR (Flu A&B, Covid) Nasopharyngeal Swab     Status: None   Collection Time: 07/08/21  9:21 AM   Specimen: Nasopharyngeal Swab; Nasopharyngeal(NP) swabs in vial transport medium  Result Value Ref Range Status   SARS Coronavirus 2 by RT PCR NEGATIVE NEGATIVE Final    Comment: (NOTE) SARS-CoV-2 target nucleic acids are NOT DETECTED.  The SARS-CoV-2 RNA is generally detectable in upper respiratory specimens during the acute phase of infection. The lowest concentration of SARS-CoV-2 viral copies this assay can detect is 138 copies/mL. A negative result does not preclude SARS-Cov-2 infection and should not be used as the sole basis for treatment or other patient management decisions. A negative result may occur with  improper specimen collection/handling, submission of specimen other than nasopharyngeal swab, presence of viral mutation(s) within the areas targeted by this assay, and inadequate number of viral copies(<138 copies/mL). A negative result must be combined with clinical observations, patient history, and epidemiological information. The expected  result is Negative.  Fact Sheet for Patients:  BloggerCourse.com  Fact Sheet for Healthcare Providers:  SeriousBroker.it  This test is no t yet approved or cleared by the [place] FDA and  has been authorized for detection and/or diagnosis of SARS-CoV-2 by FDA under an Emergency Use Authorization (EUA). This EUA will remain  in effect (meaning this test can be used) for the duration of the COVID-19 declaration under Section 564(b)(1) of the Act, 21 U.S.C.section 360bbb-3(b)(1), unless the authorization is terminated  or revoked sooner.       Influenza A by PCR NEGATIVE NEGATIVE Final   Influenza B by PCR NEGATIVE NEGATIVE Final    Comment: (NOTE) The Xpert Xpress SARS-CoV-2/FLU/RSV plus assay is intended as an aid in the diagnosis of influenza from Nasopharyngeal swab specimens and should not be used as a sole basis for treatment. Nasal washings and aspirates are unacceptable for Xpert Xpress SARS-CoV-2/FLU/RSV testing.  Fact Sheet for Patients: BloggerCourse.com  Fact Sheet for Healthcare Providers: SeriousBroker.it  This test is not yet approved or cleared by the [place] FDA and has been authorized for detection and/or diagnosis of SARS-CoV-2 by FDA under an Emergency Use Authorization (EUA). This EUA will remain in effect (meaning this test can be used) for the duration of the COVID-19 declaration under Section 564(b)(1) of the Act, 21 U.S.C. section 360bbb-3(b)(1), unless the authorization is terminated or revoked.  Performed at [facility], [address], [place], [place] [address]     RADIOLOGY STUDIES/RESULTS: CT HEAD WO CONTRAST ( )  Result Date: 07/08/2021 CLINICAL DATA:  Trauma EXAM: CT HEAD WITHOUT CONTRAST TECHNIQUE: Contiguous axial images were obtained from the base of the skull through the vertex without intravenous contrast.  COMPARISON:  None. FINDINGS: Brain: There is no evidence of acute intracranial hemorrhage, extra-axial fluid collection, or infarct. There is encephalomalacia in the left occipital lobe and right parietal lobe consistent with remote infarcts. There are remote lacunar infarcts in the bilateral basal ganglia. Additional hypodensities throughout the subcortical and periventricular white matter likely reflects sequela of chronic white matter microangiopathy. There is mild global parenchymal volume loss with ex vacuo enlargement of the ventricular system. No mass lesion is identified. There is no midline shift. Vascular: There is calcification of the bilateral cavernous ICAs and vertebral arteries. Skull: Normal. Negative for fracture or focal lesion. Sinuses/Orbits: There is mild mucosal thickening along the floor the  right maxillary sinus. The remaining paranasal sinuses are clear. Bilateral lens implants are in place. The globes and orbits are otherwise unremarkable. Other: None. IMPRESSION: 1. No acute intracranial hemorrhage or calvarial fracture. 2. 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 Electronically Signed   By: Lesia HausenPeter  Noone M.D.   On: 07/08/2021 10:23   CT FEMUR RIGHT WO CONTRAST  Result Date: 07/08/2021 CLINICAL DATA:  Femur fracture. Unwitnessed fall after rolling out of bed. EXAM: CT OF THE LOWER RIGHT EXTREMITY WITHOUT CONTRAST TECHNIQUE: Multidetector CT imaging of the right lower extremity was performed according to the standard protocol. COMPARISON:  X-ray hip 07/08/2021 FINDINGS: Bones/Joint/Cartilage Diffusely decreased bone density. No acute displaced fracture or dislocation. Mild degenerative changes of the right hip. Moderate to severe degenerative changes of the right knee most prominent along the medial tibiofemoral compartment. Small joint effusion of the knee. Ligaments Suboptimally  assessed by CT. Muscles and Tendons Grossly unremarkable. Soft tissues No large hematoma formation.  Atherosclerotic plaque. IMPRESSION: 1. No acute displaced fracture or dislocation. 2. Diffusely decreased bone density. 3. Degenerative changes of the right knee with associated effusion. Electronically Signed   By: Tish FredericksonMorgane  Naveau M.D.   On: 07/08/2021 23:36   DG Hip Unilat W or Wo Pelvis 2-3 Views Left  Result Date: 07/08/2021 CLINICAL DATA:  Larey SeatFell 2 days ago. EXAM: DG HIP (WITH OR WITHOUT PELVIS) 2-3V RIGHT; DG HIP (WITH OR WITHOUT PELVIS) 2-3V LEFT COMPARISON:  None. FINDINGS: No acute fracture or dislocation. Mild bilateral hip osteoarthritis. IMPRESSION: 1. No acute osseous abnormality. Electronically Signed   By: Obie DredgeWilliam T Derry M.D.   On: 07/08/2021 10:07   DG Hip Unilat W or Wo Pelvis 2-3 Views Right  Result Date: 07/08/2021 CLINICAL DATA:  Larey SeatFell 2 days ago. EXAM: DG HIP (WITH OR WITHOUT PELVIS) 2-3V RIGHT; DG HIP (WITH OR WITHOUT PELVIS) 2-3V LEFT COMPARISON:  None. FINDINGS: No acute fracture or dislocation. Mild bilateral hip osteoarthritis. IMPRESSION: 1. No acute osseous abnormality. Electronically Signed   By: Obie DredgeWilliam T Derry M.D.   On: 07/08/2021 10:07     LOS: 0 days   Jeoffrey MassedShanker Zekiah Coen, MD  [facility]    To contact the attending provider between 7A-7P or the covering provider during after hours 7P-7A, please log into the web site [email or web address] and access using universal [place] password for that web site. If you do not have the password, please call the hospital operator.  07/09/2021, 9:44 AM

## 2021-07-09 NOTE — Consult Note (Signed)
WOC Nurse wound consult note Consultation was completed by review of records, images and assistance from the bedside nurse/clinical staff.   Reason for Consult:RLE Patient with fall from bed; found down unclear timing.  Edema and new onset of blistering per wife. Patient is nonverbal  Wound type: partial thickness blistering secondary to edema  Pressure Injury POA: NA Measurement:scattered 1x1/ 2x2 wounds over the RLE; some are intact serous filled, some have ruptured Wound TMH:DQQI, clean Drainage (amount, consistency, odor) not able to appreciate in images  Periwound: edema and reported pain Dressing procedure/placement/frequency:  Non adherent with antibacterial properties to the open and intact blisters, kerlix and 4" ACE from toes to upper thigh, minimal tension. Change daily  Re consult if needed, will not follow at this time. Thanks  Juniel Groene [employer], RN,CWOCN, CNS, CWON-AP [phone])

## 2021-07-09 NOTE — ED Notes (Signed)
Report given to Benedetto Coons, RN of [phone]

## 2021-07-10 LAB — BASIC METABOLIC PANEL
Anion gap: 6 (ref 5–15)
BUN: 12 mg/dL (ref 8–23)
CO2: 26 mmol/L (ref 22–32)
Calcium: 8.4 mg/dL — ABNORMAL LOW (ref 8.9–10.3)
Chloride: 99 mmol/L (ref 98–111)
Creatinine, Ser: 0.97 mg/dL (ref 0.61–1.24)
GFR, Estimated: >60 mL/min (ref >60)
Glucose, Bld: 105 mg/dL — ABNORMAL HIGH (ref 70–99)
Potassium: 3.8 mmol/L (ref 3.5–5.1)
Sodium: 131 mmol/L — ABNORMAL LOW (ref 135–145)

## 2021-07-10 LAB — CK: Total CK: 4657 U/L — ABNORMAL HIGH (ref 49–397)

## 2021-07-10 MED ORDER — LACTATED RINGERS IV SOLN: INTRAVENOUS | Status: DC

## 2021-07-10 NOTE — Discharge Instructions (Signed)

## 2021-07-10 NOTE — Progress Notes (Signed)
Right leg dressed with xeroform, dry gauze, kerlix and ace wrap per order

## 2021-07-10 NOTE — Evaluation (Signed)
Occupational Therapy Evaluation Patient Details Name: Danny Woodard MRN: [id number] DOB: [birth year] Today's Date: 07/10/2021    History of Present Illness Pt is a 85yo male presenting to [facility] ED on 8/18 from facility s/p fall out of bed. Pt down for approximately 1 hour and labwork suggestive of rhabdomyolysis. Imaging negative for acute findings. PMH: dementia, Parkinson's Disease, PAF, hypothyroidism, HTN.   Clinical Impression   Pt is a poor historian 2/2 cognitive baseline deficits and no family present to confirm PLOF/home setup/etc. Per chart review, pt resided in a memory care facility and received increased S/A for ADLs/ADL mobility/transfers.   Pt received semi-reclined in bed, pt agreeable to OT eval. Pt presents with confusion, fatigue, global weakness, and poor skin integrity (large blisters and edema noted to LLE- nurse monitoring). Pt soiled in urine and feces upon arrival, nurse/nurse assistant present to assist OT in cleaning up pt/bed. Pt required max-total assist x2-3 for rolling L and R using bed railings, mod assist-setup for UB self-care, and max-total assist for LB self-care. Bed level eval only 2/2 pt not tolerating EOB yet. Pt Ox1 with a flat affect. OT re-oriented pt and provided comfort/encouragement during session. Pt would benefit from continued skilled acute care OT services, see recs below, will follow acutely. Nursing in room when OT left.     Follow Up Recommendations  SNF;Supervision/Assistance - 24 hour    Equipment Recommendations  Other (comment) (defer to next venue of care)    Recommendations for Other Services Other (comment) (None)     Precautions / Restrictions Precautions Precautions: Fall Precaution Comments: Larey Seat out of bed at facility. Restrictions Weight Bearing Restrictions: No      Mobility Bed Mobility Overal bed mobility: Needs Assistance Bed Mobility: Rolling Rolling: +2 for physical assistance;Total assist         General  bed mobility comments: Mobility limited to bed mobility as pt with BM upon entry. Pt required total assist +2 for rolling in bed multiple times for clean up; noted increased rigidity and weakness. Able to assist very minimally with UEs.    Transfers     General transfer comment: not tolerating EOB during eval    Balance Overall balance assessment:  (N/A bed level only)       ADL either performed or assessed with clinical judgement   ADL Overall ADL's : Needs assistance/impaired Eating/Feeding: Set up;Supervision/ safety;Bed level   Grooming: Wash/dry face;Set up;Supervision/safety;Bed level   Upper Body Bathing: Moderate assistance;Bed level   Lower Body Bathing: Maximal assistance;Total assistance;+2 for physical assistance;Bed level   Upper Body Dressing : Moderate assistance;Bed level   Lower Body Dressing: Total assistance;+2 for physical assistance;Bed level   Toilet Transfer:  (NA, not tolerating EOB yet)   Toileting- Clothing Manipulation and Hygiene: Total assistance;+2 for physical assistance;Bed level Toileting - Clothing Manipulation Details (indicate cue type and reason): with foley, total assist x2-3 for perianal care in side lying Tub/ Shower Transfer:  (N/A)   Functional mobility during ADLs: Total assistance;Maximal assistance;+2 for safety/equipment;+2 for physical assistance (rolling L and R in bed) General ADL Comments: bed level eval only, +2-3 for rolling L and R in bed and total assist +2-3 for pt clean up/bed change after leaking foley and BM in bed     Vision Baseline Vision/History: No visual deficits Patient Visual Report: No change from baseline Vision Assessment?: No apparent visual deficits Additional Comments: readers only     Perception Perception Perception Tested?: No   Praxis Praxis Praxis tested?: Not  tested    Pertinent Vitals/Pain Pain Assessment: No/denies pain     Hand Dominance Right   Extremity/Trunk Assessment Upper  Extremity Assessment Upper Extremity Assessment: Generalized weakness;Overall WFL for tasks assessed   Lower Extremity Assessment Lower Extremity Assessment: Generalized weakness RLE Deficits / Details: Large blisters noted throughout RLE   Cervical / Trunk Assessment Cervical / Trunk Assessment: Normal   Communication Communication Communication: HOH   Cognition Arousal/Alertness: Awake/alert Behavior During Therapy: Flat affect Overall Cognitive Status: History of cognitive impairments - at baseline       General Comments: Pt alert to self only, able to respond to simple questions and commands.   General Comments  edema to BLEs, large blisters to RLE            Home Living Family/patient expects to be discharged to:: Other (Comment)           Additional Comments: memory care unit      Prior Functioning/Environment Level of Independence: Needs assistance  Gait / Transfers Assistance Needed: per chart, pt transfers from bed to [facility], but assume pt requires assist. ADL's / Homemaking Assistance Needed: Unsure of baseline, but anticipate pt requires assist for all ADLs.            OT Problem List: Decreased strength;Decreased activity tolerance;Impaired balance (sitting and/or standing);Decreased cognition;Decreased safety awareness;Increased edema      OT Treatment/Interventions: Self-care/ADL training;Therapeutic exercise;Therapeutic activities;Patient/family education;DME and/or AE instruction    OT Goals(Current goals can be found in the care plan section) Acute Rehab OT Goals Patient Stated Goal: go home OT Goal Formulation: With patient Time For Goal Achievement: 07/24/21 Potential to Achieve Goals: Fair  OT Frequency: Min 2X/week    AM-PAC OT "6 Clicks" Daily Activity     Outcome Measure Help from another person eating meals?: A Little Help from another person taking care of personal grooming?: A Little Help from another person toileting, which  includes using toliet, bedpan, or urinal?: Total Help from another person bathing (including washing, rinsing, drying)?: A Lot Help from another person to put on and taking off regular upper body clothing?: A Little Help from another person to put on and taking off regular lower body clothing?: Total 6 Click Score: 13   End of Session Equipment Utilized During Treatment: Other (comment) (tele) Nurse Communication: Mobility status  Activity Tolerance: Patient limited by fatigue Patient left: in bed;with call bell/phone within reach;with nursing/sitter in room  OT Visit Diagnosis: Muscle weakness (generalized) (M62.81);Other symptoms and signs involving cognitive function                Time: 3335-4562 OT Time Calculation (min): 25 min Charges:  OT General Charges $OT Visit: 1 Visit OT Evaluation $OT Eval Moderate Complexity: 1 Mod OT Treatments $Self Care/Home Management : 8-22 mins  Norris Cross, OTR/L [facility] [phone]  Mechele Claude 07/10/2021, 4:35 PM

## 2021-07-10 NOTE — Progress Notes (Signed)
PROGRESS NOTE        PATIENT DETAILS Name: Danny Woodard Age: 85 y.o. Sex: male Date of Birth: 1936/08/13 Admit Date: 07/08/2021 Admitting Physician Dewayne ShorterShanker Levora DredgeM Raed Schalk, MD PCP:Pcp, No  Brief Narrative: Patient is a 85 y.o. male with history of Parkinson's disease, dementia, mostly bed to wheelchair bound (able to transfer) who sustained a mechanical fall (while transferring from wheelchair to bed) and was found on the floor by his nursing facility staff.  Per patient report he was on the floor for more than an hour before he was found.  He was subsequently admitted to the hospitalist service.  Significant events: 8/18>> admit for mechanical fall-rhabdomyolysis.  Significant studies: 8/18>> CT head: No acute intracranial abnormalities 8/18>> x-ray right/left hip: No acute osseous abnormality. 8/18>> CT right femur: No fracture.  Antimicrobial therapy: None  Microbiology data: 8/18>> COVID/influenza PCR: Negative  Procedures : None  Consults: None  DVT Prophylaxis : apixaban (ELIQUIS) tablet 5 mg    Subjective: Awake-relatively alert-answering simple questions appropriately.   Assessment/Plan: Rhabdomyolysis: Due to fall/being on the floor for more than an hour.CK levels now downtrending with IVF-renal function stable-continue supportive care and recheck electrolytes/CK levels in the morning.  Mechanical fall: Mostly bed to wheelchair bound-but able to transfer-minimally ambulate.  CT imaging negative for fractures.  Appears to be more weaker than usual baseline per PT/OT eval-recommendations are for SNF. Suspect this fall is due to frailty/chronic debility/Parkinson's disease associated autonomic dysfunction.  PAF: Maintaining sinus rhythm-continue Eliquis.  HTN: Stable-continue Imdur  Parkinson's disease: Continue carbidopa/levodopa/amantadine.  Hypothyroidism: Continue Synthroid  Dementia: Unclear whether this is Alzheimer's dementia  or dementia related to Parkinson's disease.  Seems minimally confused-family not present-not clear whether he is at baseline.  Depression: Stable-continue Lexapro  Right lower extremity blisters: Per patient-this occurred after he fell-suspect this is due to edema from falling and lying mostly on his right side.  Wound care consult appreciated.  Obesity Body mass index is 34.96 kg/m.   Diet: Diet Order             Diet Heart Room service appropriate? Yes; Fluid consistency: Thin  Diet effective now                    Code Status: DNR  Family Communication: [phone]-[phone] over the phone on 8/20  Disposition Plan: Status is: Inpatient  The patient will require care spanning > 2 midnights and should be moved to inpatient because: Inpatient level of care appropriate due to severity of illness  Dispo: The patient is from: ALF              Anticipated d/c is to: SNF vs back to ALF              Patient currently is not medically stable to d/c.   Difficult to place patient No    Barriers to Discharge: Worsening rhabdo with increasing CK levels-continue IV fluids.  Await PT evaluation to ensure he is safe to go back to ALF.  Antimicrobial agents: Anti-infectives (From admission, onward)    None        Time spent: 25 minutes-Greater than 50% of this time was spent in counseling, explanation of diagnosis, planning of further management, and coordination of care.  MEDICATIONS: Scheduled Meds:  amantadine  100 mg Oral BID   apixaban  5 mg  Oral BID   carbidopa-levodopa  1 tablet Oral TID   docusate sodium  100 mg Oral QHS   escitalopram  10 mg Oral Daily   ferrous sulfate  330 mg Oral Daily   isosorbide mononitrate  10 mg Oral Daily   levothyroxine  88 mcg Oral Daily   omega-3 acid ethyl esters  1 g Oral Daily   prednisoLONE acetate  1 drop Both Eyes BID   Continuous Infusions:   PRN Meds:.acetaminophen **OR** acetaminophen, nitroGLYCERIN,  senna-docusate   PHYSICAL EXAM: Vital signs: Vitals:   07/09/21 1933 07/10/21 0005 07/10/21 0420 07/10/21 0753  BP: 135/85 (!) 116/91 (!) 151/97 140/84  Pulse: 91 87 82 76  Resp: 20 20 20 16   Temp: 98.5 F (36.9 C) 98.4 F (36.9 C) 98.4 F (36.9 C) 98.1 F (36.7 C)  TempSrc: Oral Oral Oral Oral  SpO2: 98% 98% 96% 97%  Weight:      Height:       Filed Weights   07/08/21 1937 07/09/21 1511  Weight: 106.1 kg 130.3 kg   Body mass index is 34.96 kg/m.   Gen Exam: Chronically frail appearing-not in any distress-answering simple questions. HEENT:atraumatic, normocephalic Chest: B/L clear to auscultation anteriorly CVS:S1S2 regular Abdomen:soft non tender, non distended Extremities: Some blisters continue on the right lower extremity-some have deroofed.  Minimal swelling persists. Neurology: Non focal-but has generalized weakness. Skin: no rash   I have personally reviewed following labs and imaging studies  LABORATORY DATA: CBC: Recent Labs  Lab 07/08/21 0835 07/08/21 0915 07/09/21 0219  WBC 10.5  --  8.9  HGB 12.3* 13.6 11.5*  HCT 39.8 40.0 37.1*  MCV 81.6  --  83.0  PLT 158  --  135*     Basic Metabolic Panel: Recent Labs  Lab 07/08/21 0835 07/08/21 0915 07/08/21 1556 07/09/21 0219 07/10/21 0102  NA 136 138 136 136 131*  K 5.0 5.0 4.3 4.3 3.8  CL 102 100 100 102 99  CO2 26  --  26 23 26   GLUCOSE 165* 163* 137* 115* 105*  BUN 13 17 12 12 12   CREATININE 1.23 1.10 1.14 1.04 0.97  CALCIUM 9.3  --  9.1 8.7* 8.4*  MG 1.9  --   --   --   --      GFR: Estimated Creatinine Clearance: 86.5 mL/min (by C-G formula based on SCr of 0.97 mg/dL).  Liver Function Tests: Recent Labs  Lab 07/08/21 0835 07/09/21 0219  AST 38 96*  ALT 23 29  ALKPHOS 80 75  BILITOT 0.9 1.0  PROT 7.3 6.6  ALBUMIN 3.3* 3.0*    No results for input(s): LIPASE, AMYLASE in the last 168 hours. No results for input(s): AMMONIA in the last 168 hours.  Coagulation Profile: No  results for input(s): INR, PROTIME in the last 168 hours.  Cardiac Enzymes: Recent Labs  Lab 07/08/21 0835 07/08/21 1556 07/09/21 0219 07/10/21 0102  CKTOTAL 1,902* 5,398* 6,095* 4,657*     BNP (last 3 results) No results for input(s): PROBNP in the last 8760 hours.  Lipid Profile: No results for input(s): CHOL, HDL, LDLCALC, TRIG, CHOLHDL, LDLDIRECT in the last 72 hours.  Thyroid Function Tests: No results for input(s): TSH, T4TOTAL, FREET4, T3FREE, THYROIDAB in the last 72 hours.  Anemia Panel: No results for input(s): VITAMINB12, FOLATE, FERRITIN, TIBC, IRON, RETICCTPCT in the last 72 hours.  Urine analysis: No results found for: COLORURINE, APPEARANCEUR, LABSPEC, PHURINE, GLUCOSEU, HGBUR, BILIRUBINUR, KETONESUR, PROTEINUR, UROBILINOGEN, NITRITE, LEUKOCYTESUR  Sepsis Labs: Lactic Acid, Venous No results found for: LATICACIDVEN  MICROBIOLOGY: Recent Results (from the past 240 hour(s))  Resp Panel by RT-PCR (Flu A&B, Covid) Nasopharyngeal Swab     Status: None   Collection Time: 07/08/21  9:21 AM   Specimen: Nasopharyngeal Swab; Nasopharyngeal(NP) swabs in vial transport medium  Result Value Ref Range Status   SARS Coronavirus 2 by RT PCR NEGATIVE NEGATIVE Final    Comment: (NOTE) SARS-CoV-2 target nucleic acids are NOT DETECTED.  The SARS-CoV-2 RNA is generally detectable in upper respiratory specimens during the acute phase of infection. The lowest concentration of SARS-CoV-2 viral copies this assay can detect is 138 copies/mL. A negative result does not preclude SARS-Cov-2 infection and should not be used as the sole basis for treatment or other patient management decisions. A negative result may occur with  improper specimen collection/handling, submission of specimen other than nasopharyngeal swab, presence of viral mutation(s) within the areas targeted by this assay, and inadequate number of viral copies(<138 copies/mL). A negative result must be combined  with clinical observations, patient history, and epidemiological information. The expected result is Negative.  Fact Sheet for Patients:  BloggerCourse.com  Fact Sheet for Healthcare Providers:  SeriousBroker.it  This test is no t yet approved or cleared by the [place] FDA and  has been authorized for detection and/or diagnosis of SARS-CoV-2 by FDA under an Emergency Use Authorization (EUA). This EUA will remain  in effect (meaning this test can be used) for the duration of the COVID-19 declaration under Section 564(b)(1) of the Act, 21 U.S.C.section 360bbb-3(b)(1), unless the authorization is terminated  or revoked sooner.       Influenza A by PCR NEGATIVE NEGATIVE Final   Influenza B by PCR NEGATIVE NEGATIVE Final    Comment: (NOTE) The Xpert Xpress SARS-CoV-2/FLU/RSV plus assay is intended as an aid in the diagnosis of influenza from Nasopharyngeal swab specimens and should not be used as a sole basis for treatment. Nasal washings and aspirates are unacceptable for Xpert Xpress SARS-CoV-2/FLU/RSV testing.  Fact Sheet for Patients: BloggerCourse.com  Fact Sheet for Healthcare Providers: SeriousBroker.it  This test is not yet approved or cleared by the [place] FDA and has been authorized for detection and/or diagnosis of SARS-CoV-2 by FDA under an Emergency Use Authorization (EUA). This EUA will remain in effect (meaning this test can be used) for the duration of the COVID-19 declaration under Section 564(b)(1) of the Act, 21 U.S.C. section 360bbb-3(b)(1), unless the authorization is terminated or revoked.  Performed at [facility], [address], [place], [place] [address]     RADIOLOGY STUDIES/RESULTS: CT FEMUR RIGHT WO CONTRAST  Result Date: 07/08/2021 CLINICAL DATA:  Femur fracture. Unwitnessed fall after rolling out of bed. EXAM: CT OF THE  LOWER RIGHT EXTREMITY WITHOUT CONTRAST TECHNIQUE: Multidetector CT imaging of the right lower extremity was performed according to the standard protocol. COMPARISON:  X-ray hip 07/08/2021 FINDINGS: Bones/Joint/Cartilage Diffusely decreased bone density. No acute displaced fracture or dislocation. Mild degenerative changes of the right hip. Moderate to severe degenerative changes of the right knee most prominent along the medial tibiofemoral compartment. Small joint effusion of the knee. Ligaments Suboptimally assessed by CT. Muscles and Tendons Grossly unremarkable. Soft tissues No large hematoma formation.  Atherosclerotic plaque. IMPRESSION: 1. No acute displaced fracture or dislocation. 2. Diffusely decreased bone density. 3. Degenerative changes of the right knee with associated effusion. Electronically Signed   By: Tish Frederickson M.D.   On: 07/08/2021 23:36  LOS: 1 day   Jeoffrey Massed, MD  [facility]    To contact the attending provider between 7A-7P or the covering provider during after hours 7P-7A, please log into the web site [email or web address] and access using universal [place] password for that web site. If you do not have the password, please call the hospital operator.  07/10/2021, 10:39 AM

## 2021-07-10 NOTE — TOC Initial Note (Signed)
Transition of Care [facility]) - Initial/Assessment Note    Patient Details  Name: Danny Woodard MRN: [id number] Date of Birth: 1936-11-05  [facility] CM/SW Contact:    Joanne Chars, LCSW Phone Number: 07/10/2021, 11:26 AM  Clinical Narrative:     CSW met with pt regarding recommendation for SNF.  Pt able to participate in conversation, correctly reports that he has only been at [facility] a brief time, permission given to speak with wife Baker Janus and daughter Lorriane Shire.  Pt states he is agreeable to SNF, choice document given.  Pt also correctly states he is vaccinated for covid with one booster.    CSW spoke with pt wife Baker Janus by phone.  She was under the impression SNF rehab would be a long term place for pt to live.  Pt has been at [facility] less than two weeks.  Once she found out SNF was short term rehab, Baker Janus reports her preference would be for pt to return to [facility] and get PT services there.  She was told when he moved in that PT was available.  CSW will confirm this.  CSW LM at [facility].               Expected Discharge Plan: Assisted Living Barriers to Discharge: Continued Medical Work up   Patient Goals and CMS Choice Patient states their goals for this hospitalization and ongoing recovery are:: "back to my normal strength" CMS Medicare.gov Compare Post Acute Care list provided to:: Patient Choice offered to / list presented to : Patient  Expected Discharge Plan and Services Expected Discharge Plan: Assisted Living     Post Acute Care Choice:  (ALF) Living arrangements for the past 2 months: [place]                                      Prior Living Arrangements/Services Living arrangements for the past 2 months: [place] Lives with:: Facility Resident Patient language and need for interpreter reviewed:: Yes Do you feel safe going back to the place where you live?: Yes      Need for Family  Participation in Patient Care: Yes (Comment) Care giver support system in place?: Yes (comment) Current home services: Other (comment) (na) Criminal Activity/Legal Involvement Pertinent to Current Situation/Hospitalization: No - Comment as needed  Activities of Daily Living Home Assistive Devices/Equipment: Wheelchair ADL Screening (condition at time of admission) Patient's cognitive ability adequate to safely complete daily activities?: Yes Is the patient deaf or have difficulty hearing?: No Does the patient have difficulty seeing, even when wearing glasses/contacts?: No Does the patient have difficulty concentrating, remembering, or making decisions?: No Patient able to express need for assistance with ADLs?: Yes Does the patient have difficulty dressing or bathing?: Yes Independently performs ADLs?: No Communication: Independent Dressing (OT): Needs assistance Is this a change from baseline?: Pre-admission baseline Grooming: Needs assistance Is this a change from baseline?: Pre-admission baseline Does the patient have difficulty walking or climbing stairs?: Yes Weakness of Legs: Both Weakness of Arms/Hands: None  Permission Sought/Granted Permission sought to share information with : Family Supports Permission granted to share information with : Yes, Verbal Permission Granted  Share Information with NAME: wife Baker Janus, daughter Lorriane Shire           Emotional Assessment Appearance:: Appears stated age Attitude/Demeanor/Rapport: Engaged Affect (typically observed): Appropriate, Pleasant Orientation: : Oriented to Self,  Oriented to Place, Oriented to  Time, Oriented to Situation Alcohol / Substance Use: Not Applicable Psych Involvement: No (comment)  Admission diagnosis:  Rhabdomyolysis [M62.82] Fall, initial encounter [W19.XXXA] Patient Active Problem List   Diagnosis Date Noted   Rhabdomyolysis 07/08/2021   PAF (paroxysmal atrial fibrillation) ([place]) 07/08/2021   Parkinson's  disease ([place]) 07/08/2021   Dementia ([place]) 07/08/2021   Hypothyroidism 07/08/2021   PCP:  Merryl Hacker, No Pharmacy:   [facility], [address] [place] [place]. [place] [address] Phone: [phone] Fax: [phone]     Social Determinants of Health (SDOH) Interventions    Readmission Risk Interventions No flowsheet data found.

## 2021-07-11 LAB — URINALYSIS, ROUTINE W REFLEX MICROSCOPIC
Bilirubin Urine: NEGATIVE
Glucose, UA: NEGATIVE mg/dL
Hgb urine dipstick: NEGATIVE
Ketones, ur: NEGATIVE mg/dL
Leukocytes,Ua: NEGATIVE
Nitrite: NEGATIVE
Protein, ur: NEGATIVE mg/dL
Specific Gravity, Urine: 1.005 (ref 1.005–1.030)
pH: 7 (ref 5.0–8.0)

## 2021-07-11 LAB — BASIC METABOLIC PANEL
Anion gap: 7 (ref 5–15)
BUN: 11 mg/dL (ref 8–23)
CO2: 27 mmol/L (ref 22–32)
Calcium: 8.6 mg/dL — ABNORMAL LOW (ref 8.9–10.3)
Chloride: 101 mmol/L (ref 98–111)
Creatinine, Ser: 0.96 mg/dL (ref 0.61–1.24)
GFR, Estimated: >60 mL/min (ref >60)
Glucose, Bld: 121 mg/dL — ABNORMAL HIGH (ref 70–99)
Potassium: 3.7 mmol/L (ref 3.5–5.1)
Sodium: 135 mmol/L (ref 135–145)

## 2021-07-11 LAB — MRSA NEXT GEN BY PCR, NASAL: MRSA by PCR Next Gen: NOT DETECTED

## 2021-07-11 LAB — CK: Total CK: 2549 U/L — ABNORMAL HIGH (ref 49–397)

## 2021-07-11 NOTE — Progress Notes (Signed)
PROGRESS NOTE        PATIENT DETAILS Name: Danny Woodard Age: 85 y.o. Sex: male Date of Birth: 22-Jun-1936 Admit Date: 07/08/2021 Admitting Physician Dewayne Shorter Levora Dredge, MD PCP:Pcp, No  Brief Narrative: Patient is a 85 y.o. male with history of Parkinson's disease, dementia, mostly bed to wheelchair bound (able to transfer) who sustained a mechanical fall (while transferring from wheelchair to bed) and was found on the floor by his nursing facility staff.  Per patient report he was on the floor for more than an hour before he was found.  He was subsequently admitted to the hospitalist service.  Significant events: 8/18>> admit for mechanical fall-rhabdomyolysis.  Significant studies: 8/18>> CT head: No acute intracranial abnormalities 8/18>> x-ray right/left hip: No acute osseous abnormality. 8/18>> CT right femur: No fracture.  Antimicrobial therapy: None  Microbiology data: 8/18>> COVID/influenza PCR: Negative  Procedures : None  Consults: None  DVT Prophylaxis : apixaban (ELIQUIS) tablet 5 mg    Subjective: No major issues overnight-no complaints.  No chest pain or shortness of breath.   Assessment/Plan: Rhabdomyolysis: Due to fall/being on the floor for more than an hour.CK levels continue to downtrend-now only minimally elevated-stop all IVF.    Mechanical fall: Mostly bed to wheelchair bound-but able to transfer-minimally ambulate.  CT imaging negative for fractures.  Appears to be more weaker than usual baseline per PT/OT eval-recommendations are for SNF.  Social worker following-and discussion with family ongoing.  Will await further recommendations from the social work team.  Suspect this fall is due to frailty/chronic debility/Parkinson's disease associated autonomic dysfunction.  PAF: Maintaining sinus rhythm-continue Eliquis.  HTN: Stable-continue Imdur  Parkinson's disease: Continue  carbidopa/levodopa/amantadine.  Hypothyroidism: Continue Synthroid  Dementia: Unclear whether this is Alzheimer's dementia or dementia related to Parkinson's disease.  Seems minimally confused-family not present-not clear whether he is at baseline.  Depression: Stable-continue Lexapro  Right lower extremity blisters: Per patient-this occurred after he fell-suspect this is due to edema from falling and lying mostly on his right side.  Wound care consult appreciated.  Obesity Body mass index is 34.96 kg/m.   Diet: Diet Order             Diet Heart Room service appropriate? Yes; Fluid consistency: Thin  Diet effective now                    Code Status: DNR  Family Communication: [phone] over the phone on 8/20-no response on 8/21.  Disposition Plan: Status is: Inpatient  The patient will require care spanning > 2 midnights and should be moved to inpatient because: Inpatient level of care appropriate due to severity of illness  Dispo: The patient is from: ALF              Anticipated d/c is to: SNF vs back to ALF              Patient currently is not medically stable to d/c.   Difficult to place patient No    Barriers to Discharge: Needing SNF bed due to worsening debility.  Antimicrobial agents: Anti-infectives (From admission, onward)    None        Time spent: 25 minutes-Greater than 50% of this time was spent in counseling, explanation of diagnosis, planning of further management, and coordination of care.  MEDICATIONS: Scheduled Meds:  amantadine  100 mg Oral BID   apixaban  5 mg Oral BID   carbidopa-levodopa  1 tablet Oral TID   docusate sodium  100 mg Oral QHS   escitalopram  10 mg Oral Daily   ferrous sulfate  330 mg Oral Daily   isosorbide mononitrate  10 mg Oral Daily   levothyroxine  88 mcg Oral Daily   omega-3 acid ethyl esters  1 g Oral Daily   prednisoLONE acetate  1 drop Both Eyes BID   Continuous Infusions:   PRN  Meds:.acetaminophen **OR** acetaminophen, nitroGLYCERIN, senna-docusate   PHYSICAL EXAM: Vital signs: Vitals:   07/10/21 2046 07/10/21 2346 07/11/21 0343 07/11/21 0754  BP: 135/85 (!) 141/38 (!) 134/92 (!) 131/93  Pulse: 83 82 81 85  Resp: 20 17 17 18   Temp: 98.6 F (37 C) 98 F (36.7 C) 98.8 F (37.1 C) 98.4 F (36.9 C)  TempSrc: Oral Oral Oral Oral  SpO2: 100% 100% 92% 97%  Weight:      Height:       Filed Weights   07/08/21 1937 07/09/21 1511  Weight: 106.1 kg 130.3 kg   Body mass index is 34.96 kg/m.   Gen Exam:Alert awake-not in any distress HEENT:atraumatic, normocephalic Chest: B/L clear to auscultation anteriorly CVS:S1S2 regular Abdomen:soft non tender, non distended Extremities:no edema-right leg in Ace wrap. Neurology: Non focal Skin: no rash   I have personally reviewed following labs and imaging studies  LABORATORY DATA: CBC: Recent Labs  Lab 07/08/21 0835 07/08/21 0915 07/09/21 0219  WBC 10.5  --  8.9  HGB 12.3* 13.6 11.5*  HCT 39.8 40.0 37.1*  MCV 81.6  --  83.0  PLT 158  --  135*     Basic Metabolic Panel: Recent Labs  Lab 07/08/21 0835 07/08/21 0915 07/08/21 1556 07/09/21 0219 07/10/21 0102 07/11/21 0113  NA 136 138 136 136 131* 135  K 5.0 5.0 4.3 4.3 3.8 3.7  CL 102 100 100 102 99 101  CO2 26  --  26 23 26 27   GLUCOSE 165* 163* 137* 115* 105* 121*  BUN 13 17 12 12 12 11   CREATININE 1.23 1.10 1.14 1.04 0.97 0.96  CALCIUM 9.3  --  9.1 8.7* 8.4* 8.6*  MG 1.9  --   --   --   --   --      GFR: Estimated Creatinine Clearance: 87.4 mL/min (by C-G formula based on SCr of 0.96 mg/dL).  Liver Function Tests: Recent Labs  Lab 07/08/21 0835 07/09/21 0219  AST 38 96*  ALT 23 29  ALKPHOS 80 75  BILITOT 0.9 1.0  PROT 7.3 6.6  ALBUMIN 3.3* 3.0*    No results for input(s): LIPASE, AMYLASE in the last 168 hours. No results for input(s): AMMONIA in the last 168 hours.  Coagulation Profile: No results for input(s): INR,  PROTIME in the last 168 hours.  Cardiac Enzymes: Recent Labs  Lab 07/08/21 0835 07/08/21 1556 07/09/21 0219 07/10/21 0102 07/11/21 0113  CKTOTAL 1,902* 5,398* 6,095* 4,657* 2,549*     BNP (last 3 results) No results for input(s): PROBNP in the last 8760 hours.  Lipid Profile: No results for input(s): CHOL, HDL, LDLCALC, TRIG, CHOLHDL, LDLDIRECT in the last 72 hours.  Thyroid Function Tests: No results for input(s): TSH, T4TOTAL, FREET4, T3FREE, THYROIDAB in the last 72 hours.  Anemia Panel: No results for input(s): VITAMINB12, FOLATE, FERRITIN, TIBC, IRON, RETICCTPCT in the last 72 hours.  Urine analysis:    Component Value Date/Time  COLORURINE YELLOW 07/08/2021 0835   APPEARANCEUR CLEAR 07/08/2021 0835   LABSPEC 1.005 07/08/2021 0835   PHURINE 7.0 07/08/2021 0835   GLUCOSEU NEGATIVE 07/08/2021 0835   HGBUR NEGATIVE 07/08/2021 0835   BILIRUBINUR NEGATIVE 07/08/2021 0835   KETONESUR NEGATIVE 07/08/2021 0835   PROTEINUR NEGATIVE 07/08/2021 0835   NITRITE NEGATIVE 07/08/2021 0835   LEUKOCYTESUR NEGATIVE 07/08/2021 0835    Sepsis Labs: Lactic Acid, Venous No results found for: LATICACIDVEN  MICROBIOLOGY: Recent Results (from the past 240 hour(s))  Resp Panel by RT-PCR (Flu A&B, Covid) Nasopharyngeal Swab     Status: None   Collection Time: 07/08/21  9:21 AM   Specimen: Nasopharyngeal Swab; Nasopharyngeal(NP) swabs in vial transport medium  Result Value Ref Range Status   SARS Coronavirus 2 by RT PCR NEGATIVE NEGATIVE Final    Comment: (NOTE) SARS-CoV-2 target nucleic acids are NOT DETECTED.  The SARS-CoV-2 RNA is generally detectable in upper respiratory specimens during the acute phase of infection. The lowest concentration of SARS-CoV-2 viral copies this assay can detect is 138 copies/mL. A negative result does not preclude SARS-Cov-2 infection and should not be used as the sole basis for treatment or other patient management decisions. A negative result  may occur with  improper specimen collection/handling, submission of specimen other than nasopharyngeal swab, presence of viral mutation(s) within the areas targeted by this assay, and inadequate number of viral copies(<138 copies/mL). A negative result must be combined with clinical observations, patient history, and epidemiological information. The expected result is Negative.  Fact Sheet for Patients:  BloggerCourse.com  Fact Sheet for Healthcare Providers:  SeriousBroker.it  This test is no t yet approved or cleared by the [place] FDA and  has been authorized for detection and/or diagnosis of SARS-CoV-2 by FDA under an Emergency Use Authorization (EUA). This EUA will remain  in effect (meaning this test can be used) for the duration of the COVID-19 declaration under Section 564(b)(1) of the Act, 21 U.S.C.section 360bbb-3(b)(1), unless the authorization is terminated  or revoked sooner.       Influenza A by PCR NEGATIVE NEGATIVE Final   Influenza B by PCR NEGATIVE NEGATIVE Final    Comment: (NOTE) The Xpert Xpress SARS-CoV-2/FLU/RSV plus assay is intended as an aid in the diagnosis of influenza from Nasopharyngeal swab specimens and should not be used as a sole basis for treatment. Nasal washings and aspirates are unacceptable for Xpert Xpress SARS-CoV-2/FLU/RSV testing.  Fact Sheet for Patients: BloggerCourse.com  Fact Sheet for Healthcare Providers: SeriousBroker.it  This test is not yet approved or cleared by the [place] FDA and has been authorized for detection and/or diagnosis of SARS-CoV-2 by FDA under an Emergency Use Authorization (EUA). This EUA will remain in effect (meaning this test can be used) for the duration of the COVID-19 declaration under Section 564(b)(1) of the Act, 21 U.S.C. section 360bbb-3(b)(1), unless the authorization is terminated  or revoked.  Performed at [facility], [address], [place], [place] [address]     RADIOLOGY STUDIES/RESULTS: No results found.   LOS: 2 days   Jeoffrey Massed, MD  [facility]    To contact the attending provider between 7A-7P or the covering provider during after hours 7P-7A, please log into the web site [email or web address] and access using universal [place] password for that web site. If you do not have the password, please call the hospital operator.  07/11/2021, 11:32 AM

## 2021-07-11 NOTE — Progress Notes (Signed)
Patient orientation changing through day, initially only oriented to self. Later aboule to state name, DOB and year, incorrect month. Spoke with wife who said this is baseline.

## 2021-07-12 LAB — SARS CORONAVIRUS 2 (TAT 6-24 HRS): SARS Coronavirus 2: NEGATIVE

## 2021-07-12 NOTE — Progress Notes (Addendum)
PROGRESS NOTE        PATIENT DETAILS Name: Danny Woodard Age: 85 y.o. Sex: male Date of Birth: February 19, 1936 Admit Date: 07/08/2021 Admitting Physician Dewayne Shorter Levora Dredge, MD PCP:Pcp, No  Brief Narrative: Patient is a 85 y.o. male with history of Parkinson's disease, dementia, mostly bed to wheelchair bound (able to transfer) who sustained a mechanical fall (while transferring from wheelchair to bed) and was found on the floor by his nursing facility staff.  Per patient report he was on the floor for more than an hour before he was found.  He was subsequently admitted to the hospitalist service.  Significant events: 8/18>> admit for mechanical fall-rhabdomyolysis.  Significant studies: 8/18>> CT head: No acute intracranial abnormalities 8/18>> x-ray right/left hip: No acute osseous abnormality. 8/18>> CT right femur: No fracture.  Antimicrobial therapy: None  Microbiology data: 8/18>> COVID/influenza PCR: Negative  Procedures : None  Consults: None  DVT Prophylaxis : apixaban (ELIQUIS) tablet 5 mg    Subjective: Lying comfortably in bed-no major issues overnight.   Assessment/Plan: Rhabdomyolysis: Due to fall/being on the floor for more than an hour.CK levels are downtrending with IVF continue to monitor closely-no longer requiring IVF.  Mechanical fall: Mostly bed to wheelchair bound-but able to transfer-minimally ambulate.  CT imaging negative for fractures.  Appears to be more weaker than usual baseline per PT/OT eval-recommendations are for SNF.  Social worker following-and discussion with family ongoing.  Will await further recommendations from the social work team.  Suspect this fall is due to frailty/chronic debility/Parkinson's disease associated autonomic dysfunction.  PAF: Maintaining sinus rhythm-continue Eliquis.  HTN: Stable-continue Imdur  Parkinson's disease: Continue carbidopa/levodopa/amantadine.  Hypothyroidism: Continue  Synthroid  Dementia: Unclear whether this is Alzheimer's dementia or dementia related to Parkinson's disease.  Seems minimally confused-family not present-not clear whether he is at baseline.  Depression: Stable-continue Lexapro  Right lower extremity blisters: Per patient-this occurred after he fell-suspect this is due to edema from falling and lying mostly on his right side.  Wound care consult appreciated.  Obesity Body mass index is 34.96 kg/m.   Diet: Diet Order             Diet Heart Room service appropriate? Yes; Fluid consistency: Thin  Diet effective now                    Code Status: DNR  Family Communication: [phone] left voicemail on 8/22  Disposition Plan: Status is: Inpatient  The patient will require care spanning > 2 midnights and should be moved to inpatient because: Inpatient level of care appropriate due to severity of illness  Dispo: The patient is from: ALF              Anticipated d/c is to: SNF vs back to ALF with home health services              Patient currently is medically stable to d/c.   Difficult to place patient No    Barriers to Discharge: Awaiting either SNF bed or awaiting to see if he can go back to ALF with home health PT  Antimicrobial agents: Anti-infectives (From admission, onward)    None        Time spent: 25 minutes-Greater than 50% of this time was spent in counseling, explanation of diagnosis, planning of further management, and coordination of care.  MEDICATIONS: Scheduled Meds:  amantadine  100 mg Oral BID   apixaban  5 mg Oral BID   carbidopa-levodopa  1 tablet Oral TID   docusate sodium  100 mg Oral QHS   escitalopram  10 mg Oral Daily   ferrous sulfate  330 mg Oral Daily   isosorbide mononitrate  10 mg Oral Daily   levothyroxine  88 mcg Oral Daily   omega-3 acid ethyl esters  1 g Oral Daily   prednisoLONE acetate  1 drop Both Eyes BID   Continuous Infusions:   PRN  Meds:.acetaminophen **OR** acetaminophen, nitroGLYCERIN, senna-docusate   PHYSICAL EXAM: Vital signs: Vitals:   07/11/21 2353 07/12/21 0343 07/12/21 0737 07/12/21 1203  BP: 129/79 (!) 146/89 (!) 157/85 120/86  Pulse: 77 80 77 77  Resp: 15 20 16 13   Temp: 98.9 F (37.2 C) 98.8 F (37.1 C) 97.7 F (36.5 C) 98.4 F (36.9 C)  TempSrc: Oral Oral Oral Oral  SpO2: 95% 94% 95% 95%  Weight:      Height:       Filed Weights   07/08/21 1937 07/09/21 1511  Weight: 106.1 kg 130.3 kg   Body mass index is 34.96 kg/m.   Gen Exam:Alert awake-not in any distress HEENT:atraumatic, normocephalic Chest: B/L clear to auscultation anteriorly CVS:S1S2 regular Abdomen:soft non tender, non distended Extremities:no edema-right lower extremity in Ace wrap. Neurology: Non focal Skin: no rash   I have personally reviewed following labs and imaging studies  LABORATORY DATA: CBC: Recent Labs  Lab 07/08/21 0835 07/08/21 0915 07/09/21 0219  WBC 10.5  --  8.9  HGB 12.3* 13.6 11.5*  HCT 39.8 40.0 37.1*  MCV 81.6  --  83.0  PLT 158  --  135*     Basic Metabolic Panel: Recent Labs  Lab 07/08/21 0835 07/08/21 0915 07/08/21 1556 07/09/21 0219 07/10/21 0102 07/11/21 0113  NA 136 138 136 136 131* 135  K 5.0 5.0 4.3 4.3 3.8 3.7  CL 102 100 100 102 99 101  CO2 26  --  26 23 26 27   GLUCOSE 165* 163* 137* 115* 105* 121*  BUN 13 17 12 12 12 11   CREATININE 1.23 1.10 1.14 1.04 0.97 0.96  CALCIUM 9.3  --  9.1 8.7* 8.4* 8.6*  MG 1.9  --   --   --   --   --      GFR: Estimated Creatinine Clearance: 87.4 mL/min (by C-G formula based on SCr of 0.96 mg/dL).  Liver Function Tests: Recent Labs  Lab 07/08/21 0835 07/09/21 0219  AST 38 96*  ALT 23 29  ALKPHOS 80 75  BILITOT 0.9 1.0  PROT 7.3 6.6  ALBUMIN 3.3* 3.0*    No results for input(s): LIPASE, AMYLASE in the last 168 hours. No results for input(s): AMMONIA in the last 168 hours.  Coagulation Profile: No results for input(s):  INR, PROTIME in the last 168 hours.  Cardiac Enzymes: Recent Labs  Lab 07/08/21 0835 07/08/21 1556 07/09/21 0219 07/10/21 0102 07/11/21 0113  CKTOTAL 1,902* 5,398* 6,095* 4,657* 2,549*     BNP (last 3 results) No results for input(s): PROBNP in the last 8760 hours.  Lipid Profile: No results for input(s): CHOL, HDL, LDLCALC, TRIG, CHOLHDL, LDLDIRECT in the last 72 hours.  Thyroid Function Tests: No results for input(s): TSH, T4TOTAL, FREET4, T3FREE, THYROIDAB in the last 72 hours.  Anemia Panel: No results for input(s): VITAMINB12, FOLATE, FERRITIN, TIBC, IRON, RETICCTPCT in the last 72 hours.  Urine analysis:  Component Value Date/Time   COLORURINE YELLOW 07/08/2021 0835   APPEARANCEUR CLEAR 07/08/2021 0835   LABSPEC 1.005 07/08/2021 0835   PHURINE 7.0 07/08/2021 0835   GLUCOSEU NEGATIVE 07/08/2021 0835   HGBUR NEGATIVE 07/08/2021 0835   BILIRUBINUR NEGATIVE 07/08/2021 0835   KETONESUR NEGATIVE 07/08/2021 0835   PROTEINUR NEGATIVE 07/08/2021 0835   NITRITE NEGATIVE 07/08/2021 0835   LEUKOCYTESUR NEGATIVE 07/08/2021 0835    Sepsis Labs: Lactic Acid, Venous No results found for: LATICACIDVEN  MICROBIOLOGY: Recent Results (from the past 240 hour(s))  Resp Panel by RT-PCR (Flu A&B, Covid) Nasopharyngeal Swab     Status: None   Collection Time: 07/08/21  9:21 AM   Specimen: Nasopharyngeal Swab; Nasopharyngeal(NP) swabs in vial transport medium  Result Value Ref Range Status   SARS Coronavirus 2 by RT PCR NEGATIVE NEGATIVE Final    Comment: (NOTE) SARS-CoV-2 target nucleic acids are NOT DETECTED.  The SARS-CoV-2 RNA is generally detectable in upper respiratory specimens during the acute phase of infection. The lowest concentration of SARS-CoV-2 viral copies this assay can detect is 138 copies/mL. A negative result does not preclude SARS-Cov-2 infection and should not be used as the sole basis for treatment or other patient management decisions. A negative  result may occur with  improper specimen collection/handling, submission of specimen other than nasopharyngeal swab, presence of viral mutation(s) within the areas targeted by this assay, and inadequate number of viral copies(<138 copies/mL). A negative result must be combined with clinical observations, patient history, and epidemiological information. The expected result is Negative.  Fact Sheet for Patients:  BloggerCourse.com  Fact Sheet for Healthcare Providers:  SeriousBroker.it  This test is no t yet approved or cleared by the [place] FDA and  has been authorized for detection and/or diagnosis of SARS-CoV-2 by FDA under an Emergency Use Authorization (EUA). This EUA will remain  in effect (meaning this test can be used) for the duration of the COVID-19 declaration under Section 564(b)(1) of the Act, 21 U.S.C.section 360bbb-3(b)(1), unless the authorization is terminated  or revoked sooner.       Influenza A by PCR NEGATIVE NEGATIVE Final   Influenza B by PCR NEGATIVE NEGATIVE Final    Comment: (NOTE) The Xpert Xpress SARS-CoV-2/FLU/RSV plus assay is intended as an aid in the diagnosis of influenza from Nasopharyngeal swab specimens and should not be used as a sole basis for treatment. Nasal washings and aspirates are unacceptable for Xpert Xpress SARS-CoV-2/FLU/RSV testing.  Fact Sheet for Patients: BloggerCourse.com  Fact Sheet for Healthcare Providers: SeriousBroker.it  This test is not yet approved or cleared by the [place] FDA and has been authorized for detection and/or diagnosis of SARS-CoV-2 by FDA under an Emergency Use Authorization (EUA). This EUA will remain in effect (meaning this test can be used) for the duration of the COVID-19 declaration under Section 564(b)(1) of the Act, 21 U.S.C. section 360bbb-3(b)(1), unless the authorization is  terminated or revoked.  Performed at [facility], [address] [place], [place] [address]   MRSA Next Gen by PCR, Nasal     Status: None   Collection Time: 07/11/21  7:36 AM   Specimen: Nasal Mucosa; Nasal Swab  Result Value Ref Range Status   MRSA by PCR Next Gen NOT DETECTED NOT DETECTED Final    Comment: (NOTE) The GeneXpert MRSA Assay (FDA approved for NASAL specimens only), is one component of a comprehensive MRSA colonization surveillance program. It is not intended to diagnose MRSA infection nor to guide  or monitor treatment for MRSA infections. Test performance is not FDA approved in patients less than 10 years old. Performed at [facility], [address], [place], [place] [address]     RADIOLOGY STUDIES/RESULTS: No results found.   LOS: 3 days   Jeoffrey Massed, MD  [facility]    To contact the attending provider between 7A-7P or the covering provider during after hours 7P-7A, please log into the web site [email or web address] and access using universal [place] password for that web site. If you do not have the password, please call the hospital operator.  07/12/2021, 1:43 PM

## 2021-07-12 NOTE — [place] FL2 (Addendum)
[place] MEDICAID FL2 LEVEL OF CARE SCREENING TOOL     IDENTIFICATION  Patient Name: Danny Woodard Birthdate: April 21, 1936 Sex: male Admission Date (Current Location): 07/08/2021  [facility] and [place] Number:  Producer, television/film/video and Address:  [facility], [address], [place], [place] [address]      Provider Number: [id number]  Attending Physician Name and Address:  Maretta Bees, MD  Relative Name and Phone Number:       Current Level of Care: Hospital Recommended Level of Care: Skilled Nursing Facility Prior Approval Number:    Date Approved/Denied:   PASRR Number:   [id number] Discharge Plan:      Current Diagnoses: Patient Active Problem List   Diagnosis Date Noted   Rhabdomyolysis 07/08/2021   PAF (paroxysmal atrial fibrillation) (HCC) 07/08/2021   Parkinson's disease (HCC) 07/08/2021   Dementia (HCC) 07/08/2021   Hypothyroidism 07/08/2021    Orientation RESPIRATION BLADDER Height & Weight     Self, Time, Situation, Place  Normal Incontinent Weight: 287 lb 3.2 oz (130.3 kg) Height:  6\' 4"  (193 cm)  BEHAVIORAL SYMPTOMS/MOOD NEUROLOGICAL BOWEL NUTRITION STATUS      Continent Diet (no salt added)  AMBULATORY STATUS COMMUNICATION OF NEEDS Skin   Extensive Assist Verbally Normal                       Personal Care Assistance Level of Assistance  Bathing, Feeding, Dressing Bathing Assistance: Maximum assistance Feeding assistance: Independent Dressing Assistance: Maximum assistance     Functional Limitations Info  Sight, Speech, Hearing Sight Info: Adequate Hearing Info: Adequate Speech Info: Adequate    SPECIAL CARE FACTORS FREQUENCY  PT (By licensed PT), OT (By licensed OT)     PT Frequency: 5x/week home health OT Frequency: 5x/week home health            Contractures Contractures Info: Not present    Additional Factors Info  Code Status, Allergies Code Status Info: DNR Allergies Info: NKA  allergies           Current Medications (07/12/2021):  This is the current hospital active medication list Current Facility-Administered Medications  Medication Dose Route Frequency Provider Last Rate Last Admin   acetaminophen (TYLENOL) tablet 650 mg  650 mg Oral Q6H PRN 07/14/2021, MD       Or   acetaminophen (TYLENOL) suppository 650 mg  650 mg Rectal Q6H PRN Eduard Clos, MD       amantadine (SYMMETREL) capsule 100 mg  100 mg Oral BID Eduard Clos, MD   100 mg at 07/12/21 0941   apixaban (ELIQUIS) tablet 5 mg  5 mg Oral BID 07/14/21, MD   5 mg at 07/12/21 07/14/21   carbidopa-levodopa (SINEMET IR) 25-100 MG per tablet immediate release 1 tablet  1 tablet Oral TID 0762, MD   1 tablet at 07/12/21 07/14/21   docusate sodium (COLACE) capsule 100 mg  100 mg Oral QHS 2633, MD   100 mg at 07/11/21 2118   escitalopram (LEXAPRO) tablet 10 mg  10 mg Oral Daily 2119, MD   10 mg at 07/12/21 07/14/21   ferrous sulfate 300 (60 Fe) MG/5ML syrup 330 mg  330 mg Oral Daily 3545, MD   330 mg at 07/12/21 07/14/21   isosorbide mononitrate (ISMO) tablet 10 mg  10 mg Oral Daily 6256, MD   10 mg at  07/12/21 0939   levothyroxine (SYNTHROID) tablet 88 mcg  88 mcg Oral Daily Eduard Clos, MD   88 mcg at 07/12/21 0534   nitroGLYCERIN (NITROSTAT) SL tablet 0.4 mg  0.4 mg Sublingual Q5 min PRN Eduard Clos, MD       omega-3 acid ethyl esters (LOVAZA) capsule 1 g  1 g Oral Daily Eduard Clos, MD   1 g at 07/12/21 6503   prednisoLONE acetate (PRED FORTE) 1 % ophthalmic suspension 1 drop  1 drop Both Eyes BID Eduard Clos, MD   1 drop at 07/12/21 5465   senna-docusate (Senokot-S) tablet 1 tablet  1 tablet Oral QHS PRN Eduard Clos, MD         Discharge Medications: Please see discharge summary for a list of discharge medications.  Relevant Imaging Results:  Relevant Lab  Results:   Additional Information Laurette Schimke LCSW SSN: [id number]. Pt is covid vaccinated with 1 booster  [employer], LCSW

## 2021-07-12 NOTE — TOC Progression Note (Addendum)
Transition of Care [facility]) - Progression Note    Patient Details  Name: Danny Woodard MRN: [id number] Date of Birth: Aug 11, 1936  Transition of Care [facility]) CM/SW Contact  Rasheen Bells [place], [place] Phone Number: 07/12/2021, 10:15 AM  Clinical Narrative:     CSW called [facility] and left message with RN requesting return call.  1205: Called RN at [employer] and  updated her that family wanting pt to return vs SNF. She requests that FL2 and clinicals be emailed to [email or web address] for her clinical director to review. CSW emailed documents.   1352: RN notified CSW that [facility] likely will want pt to go to Rehab before hand but is waiting on her leadership's decision.   CSW called pt's spouse and explained the likely outcome. She is agreeable to SNF workup for now. Pt went to a SNF in [facility] about 15 months ago. She is okay with SNF [place] for [facility] area. Pt is covid vaccinated with 1 booster. CSW will complete FL2 and fax bed requests.   1654: Auth started; facility needs to be added to auth  Expected Discharge Plan: Assisted Living Barriers to Discharge: Continued Medical Work up  Expected Discharge Plan and Services Expected Discharge Plan: Assisted Living     Post Acute Care Choice:  (ALF) Living arrangements for the past 2 months: Assisted Living Facility                                       Social Determinants of Health (SDOH) Interventions    Readmission Risk Interventions No flowsheet data found.

## 2021-07-12 NOTE — Care Management Important Message (Signed)
Important Message  Patient Details  Name: Danny Woodard MRN: [id number] Date of Birth: May 13, 1936   Medicare Important Message Given:  Yes     Shelise Maron 07/12/2021, 4:05 PM

## 2021-07-13 MED ORDER — COVID-19 MRNA VAC-TRIS(PFIZER) 30 MCG/0.3ML IM SUSP
0.3000 mL | Freq: Once | INTRAMUSCULAR | Status: AC
  Administered 2021-07-13: 0.3 mL via INTRAMUSCULAR
  Filled 2021-07-13: qty 0.3

## 2021-07-13 MED ORDER — VITAMIN B12 1000 MCG PO TBCR: 1000.0000 mg | EXTENDED_RELEASE_TABLET | ORAL | Status: AC

## 2021-07-13 NOTE — TOC Transition Note (Signed)
Transition of Care [facility]) - CM/SW Discharge Note   Patient Details  Name: Danny Woodard MRN: [id number] Date of Birth: 1936/04/03  Transition of Care [facility]) CM/SW Contact:  Erin Sons, LCSW Phone Number: 07/13/2021, 2:24 PM   Clinical Narrative:     Patient will DC to: [facility] Anticipated DC date: 07/13/21 Family notified: [name] Transport by:   Per MD patient ready for DC to [facility]. RN, patient, patient's family, and facility notified of DC. Discharge Summary and FL2 sent to facility. RN to call report prior to discharge [phone]). DC packet on chart. Ambulance transport requested for patient.   CSW will sign off for now as social work intervention is no longer needed. Please consult [place] again if new needs arise.   Final next level of care: Skilled Nursing Facility Barriers to Discharge: No Barriers Identified   Patient Goals and CMS Choice Patient states their goals for this hospitalization and ongoing recovery are:: "back to my normal strength" CMS Medicare.gov Compare Post Acute Care list provided to:: Patient Choice offered to / list presented to : Patient  Discharge Placement              Patient [facility] bed at: [facility] Patient to be transferred to facility by: [facility] Name of family member notified: [name] Patient and family notified of of transfer: 07/13/21  Discharge Plan and Services     Post Acute Care Choice:  (ALF)                               Social Determinants of Health (SDOH) Interventions     Readmission Risk Interventions No flowsheet data found.

## 2021-07-13 NOTE — Progress Notes (Signed)
53- Ambulance transport arrived to transport patient to [facility]. Report called and given to receiving facility nurse, Marena Chancy. All questions and concerns answered.

## 2021-07-13 NOTE — Discharge Summary (Signed)
PATIENT DETAILS Name: Danny Woodard Age: 85 y.o. Sex: male Date of Birth: Dec 20, 1935 MRN: [id number]. Admitting Physician: Maretta Bees, MD PCP:Pcp, No  Admit Date: 07/08/2021 Discharge date: 07/13/2021  Recommendations for Outpatient Follow-up:  Follow up with PCP in 1-2 weeks Please obtain CMP/CBC in one week Repeat CK in 1 week-and resume statin accordingly.  Statin on hold at the time of discharge.  Admitted From:  ALF  Disposition: SNF   Home Health: No  Equipment/Devices: None  Discharge Condition: Stable  CODE STATUS: DNR  Diet recommendation:  Diet Order             Diet - low sodium heart healthy           Diet Heart Room service appropriate? Yes; Fluid consistency: Thin  Diet effective now                    Brief Summary: Patient is a 85 y.o. male with history of Parkinson's disease, dementia, mostly bed to wheelchair bound (able to transfer) who sustained a mechanical fall (while transferring from wheelchair to bed) and was found on the floor by his nursing facility staff.  Per patient report he was on the floor for more than an hour before he was found.  He was subsequently admitted to the hospitalist service.   Significant events: 8/18>> admit for mechanical fall-rhabdomyolysis.   Significant studies: 8/18>> CT head: No acute intracranial abnormalities 8/18>> x-ray right/left hip: No acute osseous abnormality. 8/18>> CT right femur: No fracture.   Antimicrobial therapy: None   Microbiology data: 8/18>> COVID/influenza PCR: Negative   Procedures : None   Consults: None  Brief Hospital Course: Rhabdomyolysis: Due to fall/being on the floor for more than an hour.CK levels have down trended with IVF.  No longer on IV fluids-please repeat CK levels in 1 week.   Mechanical fall: Mostly bed to wheelchair bound-but able to transfer-minimally ambulate.  CT imaging negative for fractures.  Appears to be more weaker than usual  baseline per PT/OT eval-recommendations are for SNF.  Suspect this fall is due to frailty/chronic debility/Parkinson's disease associated autonomic dysfunction.   PAF: Maintaining sinus rhythm-continue Eliquis.   HTN: Stable-continue Imdur   Parkinson's disease: Continue carbidopa/levodopa/amantadine.   Hypothyroidism: Continue Synthroid   Dementia: Unclear whether this is Alzheimer's dementia or dementia related to Parkinson's disease.  Seems minimally confused-family not present-not clear whether he is at baseline.   Depression: Stable-continue Lexapro   Right lower extremity blisters: Per patient-this occurred after he fell-suspect this is due to edema from falling and lying mostly on his right side.  Wound care consult appreciated-continue Ace wrap on discharge-see wound care instructions below.   Obesity Body mass index is 34.96 kg/m   Procedures None  Discharge Diagnoses:  Principal Problem:   Rhabdomyolysis Active Problems:   PAF (paroxysmal atrial fibrillation) (HCC)   Parkinson's disease (HCC)   Dementia (HCC)   Hypothyroidism   Discharge Instructions:  Activity:  As tolerated   Discharge Instructions     Call MD for:  difficulty breathing, headache or visual disturbances   Complete by: As directed    Call MD for:  extreme fatigue   Complete by: As directed    Diet - low sodium heart healthy   Complete by: As directed    Discharge wound care:   Complete by: As directed    Apply single layer of xeroform gauze over open and intact blisters lightly, top with dry gauze pads  and secure with kerlix. Wrap RLE with 4" ACE wrap from toes to upper thigh with minimal compression. Change daily   Increase activity slowly   Complete by: As directed       Allergies as of 07/13/2021   No Known Allergies      Medication List     STOP taking these medications    Aspirin Low Dose 81 MG chewable tablet Generic drug: aspirin   rosuvastatin 40 MG tablet Commonly  known as: CRESTOR       TAKE these medications    acetaminophen 500 MG tablet Commonly known as: TYLENOL Take 1,000 mg by mouth every 6 (six) hours as needed for mild pain or fever.   alum & mag hydroxide-simeth 200-200-20 MG/5ML suspension Commonly known as: MAALOX/MYLANTA Take 30 mLs by mouth every 6 (six) hours as needed for indigestion or heartburn.   amantadine 100 MG capsule Commonly known as: SYMMETREL Take 100 mg by mouth 2 (two) times daily.   Calmoseptine 0.44-20.6 % Oint Generic drug: Menthol-Zinc Oxide Apply 1 application topically as needed (to buttocks after incontinence).   carbidopa-levodopa 25-100 MG tablet Commonly known as: SINEMET IR Take 1 tablet by mouth 3 (three) times daily.   cholecalciferol 25 MCG (1000 UNIT) tablet Commonly known as: VITAMIN D Take 1,000 Units by mouth daily.   docusate sodium 100 MG capsule Commonly known as: COLACE Take 100 mg by mouth at bedtime.   Eliquis 5 MG Tabs tablet Generic drug: apixaban Take 5 mg by mouth 2 (two) times daily.   escitalopram 10 MG tablet Commonly known as: LEXAPRO Take 10 mg by mouth daily.   ferrous sulfate 220 (44 Fe) MG/5ML solution Take 330 mg by mouth daily.   isosorbide mononitrate 10 MG tablet Commonly known as: ISMO Take 30 mg by mouth daily.   levothyroxine 88 MCG tablet Commonly known as: SYNTHROID Take 88 mcg by mouth daily.   nitroGLYCERIN 0.4 MG SL tablet Commonly known as: NITROSTAT Place 0.4 mg under the tongue every 5 (five) minutes as needed for chest pain.   nystatin powder Commonly known as: MYCOSTATIN/NYSTOP Apply 1 application topically at bedtime. And as needed for rash/irritation to buttocks and upper thighs.   omega-3 acid ethyl esters 1 g capsule Commonly known as: LOVAZA Take 1 g by mouth daily.   potassium chloride 20 MEQ packet Commonly known as: KLOR-CON Take 20 mEq by mouth 2 (two) times daily.   prednisoLONE acetate 1 % ophthalmic  suspension Commonly known as: PRED FORTE Place 1 drop into both eyes in the morning and at bedtime.   senna-docusate 8.6-50 MG tablet Commonly known as: Senokot-S Take 1 tablet by mouth at bedtime as needed for mild constipation.   Skin Prep Wipes Misc Apply 1 application topically at bedtime. To back area   Vitamin B12 1000 MCG Tbcr Take 1,000 mg by mouth once a week. What changed: when to take this               Discharge Care Instructions  (From admission, onward)           Start     Ordered   07/13/21 0000  Discharge wound care:       Comments: Apply single layer of xeroform gauze over open and intact blisters lightly, top with dry gauze pads and secure with kerlix. Wrap RLE with 4" ACE wrap from toes to upper thigh with minimal compression. Change daily   07/13/21 1128  Follow-up Information     Primary Care MD. Schedule an appointment as soon as possible for a visit in 1 week(s).                 No Known Allergies    Consultations:  None   Other Procedures/Studies: CT HEAD WO CONTRAST ( )  Result Date: 07/08/2021 CLINICAL DATA:  Trauma EXAM: CT HEAD WITHOUT CONTRAST TECHNIQUE: Contiguous axial images were obtained from the base of the skull through the vertex without intravenous contrast. COMPARISON:  None. FINDINGS: Brain: There is no evidence of acute intracranial hemorrhage, extra-axial fluid collection, or infarct. There is encephalomalacia in the left occipital lobe and right parietal lobe consistent with remote infarcts. There are remote lacunar infarcts in the bilateral basal ganglia. Additional hypodensities throughout the subcortical and periventricular white matter likely reflects sequela of chronic white matter microangiopathy. There is mild global parenchymal volume loss with ex vacuo enlargement of the ventricular system. No mass lesion is identified. There is no midline shift. Vascular: There is calcification of the  bilateral cavernous ICAs and vertebral arteries. Skull: Normal. Negative for fracture or focal lesion. Sinuses/Orbits: There is mild mucosal thickening along the floor the right maxillary sinus. The remaining paranasal sinuses are clear. Bilateral lens implants are in place. The globes and orbits are otherwise unremarkable. Other: None. IMPRESSION: 1. No acute intracranial hemorrhage or calvarial fracture. 2. 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 Electronically Signed   By: Lesia Hausen M.D.   On: 07/08/2021 10:23   CT FEMUR RIGHT WO CONTRAST  Result Date: 07/08/2021 CLINICAL DATA:  Femur fracture. Unwitnessed fall after rolling out of bed. EXAM: CT OF THE LOWER RIGHT EXTREMITY WITHOUT CONTRAST TECHNIQUE: Multidetector CT imaging of the right lower extremity was performed according to the standard protocol. COMPARISON:  X-ray hip 07/08/2021 FINDINGS: Bones/Joint/Cartilage Diffusely decreased bone density. No acute displaced fracture or dislocation. Mild degenerative changes of the right hip. Moderate to severe degenerative changes of the right knee most prominent along the medial tibiofemoral compartment. Small joint effusion of the knee. Ligaments Suboptimally assessed by CT. Muscles and Tendons Grossly unremarkable. Soft tissues No large hematoma formation.  Atherosclerotic plaque. IMPRESSION: 1. No acute displaced fracture or dislocation. 2. Diffusely decreased bone density. 3. Degenerative changes of the right knee with associated effusion. Electronically Signed   By: Tish Frederickson M.D.   On: 07/08/2021 23:36   DG Hip Unilat W or Wo Pelvis 2-3 Views Left  Result Date: 07/08/2021 CLINICAL DATA:  Larey Seat 2 days ago. EXAM: DG HIP (WITH OR WITHOUT PELVIS) 2-3V RIGHT; DG HIP (WITH OR WITHOUT PELVIS) 2-3V LEFT COMPARISON:  None. FINDINGS: No acute fracture or dislocation. Mild bilateral hip osteoarthritis.  IMPRESSION: 1. No acute osseous abnormality. Electronically Signed   By: Obie Dredge M.D.   On: 07/08/2021 10:07   DG Hip Unilat W or Wo Pelvis 2-3 Views Right  Result Date: 07/08/2021 CLINICAL DATA:  Larey Seat 2 days ago. EXAM: DG HIP (WITH OR WITHOUT PELVIS) 2-3V RIGHT; DG HIP (WITH OR WITHOUT PELVIS) 2-3V LEFT COMPARISON:  None. FINDINGS: No acute fracture or dislocation. Mild bilateral hip osteoarthritis. IMPRESSION: 1. No acute osseous abnormality. Electronically Signed   By: Obie Dredge M.D.   On: 07/08/2021 10:07     TODAY-DAY OF DISCHARGE:  Subjective:   Leane Call today has no headache,no chest abdominal pain,no new weakness tingling or numbness, feels much better wants to  go home today.   Objective:   Blood pressure 131/82, pulse 81, temperature 98 F (36.7 C), temperature source Oral, resp. rate (!) 23, height 6\' 4"  (1.93 m), weight 130.3 kg, SpO2 90 %.  Intake/Output Summary (Last 24 hours) at 07/13/2021 1128 Last data filed at 07/13/2021 1051 Gross per 24 hour  Intake 920 ml  Output 1300 ml  Net -380 ml   Filed Weights   07/08/21 1937 07/09/21 1511  Weight: 106.1 kg 130.3 kg    Exam: Awake Alert,  No new F.N deficits, Normal affect [place].AT,PERRAL Supple Neck,No JVD, No cervical lymphadenopathy appriciated.  Symmetrical Chest wall movement, Good air movement bilaterally, CTAB RRR,No Gallops,Rubs or new Murmurs, No Parasternal Heave +ve B.Sounds, Abd Soft, Non tender, No organomegaly appriciated, No rebound -guarding or rigidity. No Cyanosis, Clubbing or edema, No new Rash or bruise   PERTINENT RADIOLOGIC STUDIES: No results found.   PERTINENT LAB RESULTS: CBC: No results for input(s): WBC, HGB, HCT, PLT in the last 72 hours. CMET CMP     Component Value Date/Time   NA 135 07/11/2021 0113   K 3.7 07/11/2021 0113   CL 101 07/11/2021 0113   CO2 27 07/11/2021 0113   GLUCOSE 121 (H) 07/11/2021 0113   BUN 11 07/11/2021 0113   CREATININE 0.96 07/11/2021  0113   CALCIUM 8.6 (L) 07/11/2021 0113   PROT 6.6 07/09/2021 0219   ALBUMIN 3.0 (L) 07/09/2021 0219   AST 96 (H) 07/09/2021 0219   ALT 29 07/09/2021 0219   ALKPHOS 75 07/09/2021 0219   BILITOT 1.0 07/09/2021 0219   GFRNONAA >60 07/11/2021 0113    GFR Estimated Creatinine Clearance: 87.4 mL/min (by C-G formula based on SCr of 0.96 mg/dL). No results for input(s): LIPASE, AMYLASE in the last 72 hours. Recent Labs    07/11/21 0113  CKTOTAL 2,549*   Invalid input(s): POCBNP No results for input(s): DDIMER in the last 72 hours. No results for input(s): HGBA1C in the last 72 hours. No results for input(s): CHOL, HDL, LDLCALC, TRIG, CHOLHDL, LDLDIRECT in the last 72 hours. No results for input(s): TSH, T4TOTAL, T3FREE, THYROIDAB in the last 72 hours.  Invalid input(s): FREET3 No results for input(s): VITAMINB12, FOLATE, FERRITIN, TIBC, IRON, RETICCTPCT in the last 72 hours. Coags: No results for input(s): INR in the last 72 hours.  Invalid input(s): PT Microbiology: Recent Results (from the past 240 hour(s))  Resp Panel by RT-PCR (Flu A&B, Covid) Nasopharyngeal Swab     Status: None   Collection Time: 07/08/21  9:21 AM   Specimen: Nasopharyngeal Swab; Nasopharyngeal(NP) swabs in vial transport medium  Result Value Ref Range Status   SARS Coronavirus 2 by RT PCR NEGATIVE NEGATIVE Final    Comment: (NOTE) SARS-CoV-2 target nucleic acids are NOT DETECTED.  The SARS-CoV-2 RNA is generally detectable in upper respiratory specimens during the acute phase of infection. The lowest concentration of SARS-CoV-2 viral copies this assay can detect is 138 copies/mL. A negative result does not preclude SARS-Cov-2 infection and should not be used as the sole basis for treatment or other patient management decisions. A negative result may occur with  improper specimen collection/handling, submission of specimen other than nasopharyngeal swab, presence of viral mutation(s) within the areas  targeted by this assay, and inadequate number of viral copies(<138 copies/mL). A negative result must be combined with clinical observations, patient history, and epidemiological information. The expected result is Negative.  Fact Sheet for Patients:  07/10/21  Fact Sheet for Healthcare Providers:  BloggerCourse.com  This test is no t yet approved or cleared by the [place] FDA and  has been authorized for detection and/or diagnosis of SARS-CoV-2 by FDA under an Emergency Use Authorization (EUA). This EUA will remain  in effect (meaning this test can be used) for the duration of the COVID-19 declaration under Section 564(b)(1) of the Act, 21 U.S.C.section 360bbb-3(b)(1), unless the authorization is terminated  or revoked sooner.       Influenza A by PCR NEGATIVE NEGATIVE Final   Influenza B by PCR NEGATIVE NEGATIVE Final    Comment: (NOTE) The Xpert Xpress SARS-CoV-2/FLU/RSV plus assay is intended as an aid in the diagnosis of influenza from Nasopharyngeal swab specimens and should not be used as a sole basis for treatment. Nasal washings and aspirates are unacceptable for Xpert Xpress SARS-CoV-2/FLU/RSV testing.  Fact Sheet for Patients: BloggerCourse.comhttps://www.fda.gov/media/152166/download  Fact Sheet for Healthcare Providers: SeriousBroker.ithttps://www.fda.gov/media/152162/download  This test is not yet approved or cleared by the [place] FDA and has been authorized for detection and/or diagnosis of SARS-CoV-2 by FDA under an Emergency Use Authorization (EUA). This EUA will remain in effect (meaning this test can be used) for the duration of the COVID-19 declaration under Section 564(b)(1) of the Act, 21 U.S.C. section 360bbb-3(b)(1), unless the authorization is terminated or revoked.  Performed at [facility], [address] [place], [place] [address]   MRSA Next Gen by PCR, Nasal     Status: None   Collection Time:  07/11/21  7:36 AM   Specimen: Nasal Mucosa; Nasal Swab  Result Value Ref Range Status   MRSA by PCR Next Gen NOT DETECTED NOT DETECTED Final    Comment: (NOTE) The GeneXpert MRSA Assay (FDA approved for NASAL specimens only), is one component of a comprehensive MRSA colonization surveillance program. It is not intended to diagnose MRSA infection nor to guide or monitor treatment for MRSA infections. Test performance is not FDA approved in patients less than 83 years old. Performed at [facility], [address] [place], [place] [address]   SARS CORONAVIRUS 2 (TAT 6-24 HRS) Nasopharyngeal Nasopharyngeal Swab     Status: None   Collection Time: 07/12/21  2:33 PM   Specimen: Nasopharyngeal Swab  Result Value Ref Range Status   SARS Coronavirus 2 NEGATIVE NEGATIVE Final    Comment: (NOTE) SARS-CoV-2 target nucleic acids are NOT DETECTED.  The SARS-CoV-2 RNA is generally detectable in upper and lower respiratory specimens during the acute phase of infection. Negative results do not preclude SARS-CoV-2 infection, do not rule out co-infections with other pathogens, and should not be used as the sole basis for treatment or other patient management decisions. Negative results must be combined with clinical observations, patient history, and epidemiological information. The expected result is Negative.  Fact Sheet for Patients: HairSlick.nohttps://www.fda.gov/media/138098/download  Fact Sheet for Healthcare Providers: quierodirigir.comhttps://www.fda.gov/media/138095/download  This test is not yet approved or cleared by the [place] FDA and  has been authorized for detection and/or diagnosis of SARS-CoV-2 by FDA under an Emergency Use Authorization (EUA). This EUA will remain  in effect (meaning this test can be used) for the duration of the COVID-19 declaration under Se ction 564(b)(1) of the Act, 21 U.S.C. section 360bbb-3(b)(1), unless the authorization is terminated or revoked  sooner.  Performed at [facility], [address] [place], [place] [address]     FURTHER DISCHARGE INSTRUCTIONS:  Get Medicines reviewed and adjusted: Please take all your medications with you for your next visit with  your Primary MD  Laboratory/radiological data: Please request your Primary MD to go over all hospital tests and procedure/radiological results at the follow up, please ask your Primary MD to get all Hospital records sent to his/her office.  In some cases, they will be blood work, cultures and biopsy results pending at the time of your discharge. Please request that your primary care M.D. goes through all the records of your hospital data and follows up on these results.  Also Note the following: If you experience worsening of your admission symptoms, develop shortness of breath, life threatening emergency, suicidal or homicidal thoughts you must seek medical attention immediately by calling 911 or calling your MD immediately  if symptoms less severe.  You must read complete instructions/literature along with all the possible adverse reactions/side effects for all the Medicines you take and that have been prescribed to you. Take any new Medicines after you have completely understood and accpet all the possible adverse reactions/side effects.   Do not drive when taking Pain medications or sleeping medications (Benzodaizepines)  Do not take more than prescribed Pain, Sleep and Anxiety Medications. It is not advisable to combine anxiety,sleep and pain medications without talking with your primary care practitioner  Special Instructions: If you have smoked or chewed Tobacco  in the last 2 yrs please stop smoking, stop any regular Alcohol  and or any Recreational drug use.  Wear Seat belts while driving.  Please note: You were cared for by a hospitalist during your hospital stay. Once you are discharged, your primary care physician will handle any further medical  issues. Please note that NO REFILLS for any discharge medications will be authorized once you are discharged, as it is imperative that you return to your primary care physician (or establish a relationship with a primary care physician if you do not have one) for your post hospital discharge needs so that they can reassess your need for medications and monitor your lab values.  Total Time spent coordinating discharge including counseling, education and face to face time equals 35 minutes.  SignedJeoffrey Massed 07/13/2021 11:28 AM

## 2021-11-01 DIAGNOSIS — I259 Chronic ischemic heart disease, unspecified: Secondary | ICD-10-CM | POA: Diagnosis not present

## 2021-11-01 DIAGNOSIS — I119 Hypertensive heart disease without heart failure: Secondary | ICD-10-CM | POA: Diagnosis not present

## 2021-11-01 DIAGNOSIS — G8222 Paraplegia, incomplete: Secondary | ICD-10-CM | POA: Diagnosis not present

## 2021-11-01 DIAGNOSIS — G20 Parkinson's disease: Secondary | ICD-10-CM | POA: Diagnosis not present

## 2021-12-17 DIAGNOSIS — I10 Essential (primary) hypertension: Secondary | ICD-10-CM | POA: Diagnosis not present

## 2021-12-17 DIAGNOSIS — N1831 Chronic kidney disease, stage 3a: Secondary | ICD-10-CM | POA: Diagnosis not present

## 2021-12-17 DIAGNOSIS — E039 Hypothyroidism, unspecified: Secondary | ICD-10-CM | POA: Diagnosis not present

## 2021-12-17 DIAGNOSIS — G20 Parkinson's disease: Secondary | ICD-10-CM | POA: Diagnosis not present

## 2021-12-30 DIAGNOSIS — M4800 Spinal stenosis, site unspecified: Secondary | ICD-10-CM | POA: Diagnosis not present

## 2021-12-30 DIAGNOSIS — E039 Hypothyroidism, unspecified: Secondary | ICD-10-CM | POA: Diagnosis not present

## 2021-12-30 DIAGNOSIS — G20 Parkinson's disease: Secondary | ICD-10-CM | POA: Diagnosis not present

## 2021-12-30 DIAGNOSIS — I4891 Unspecified atrial fibrillation: Secondary | ICD-10-CM | POA: Diagnosis not present

## 2021-12-31 DIAGNOSIS — I872 Venous insufficiency (chronic) (peripheral): Secondary | ICD-10-CM | POA: Diagnosis not present

## 2021-12-31 DIAGNOSIS — F339 Major depressive disorder, recurrent, unspecified: Secondary | ICD-10-CM | POA: Diagnosis not present

## 2021-12-31 DIAGNOSIS — F0390 Unspecified dementia without behavioral disturbance: Secondary | ICD-10-CM | POA: Diagnosis not present

## 2021-12-31 DIAGNOSIS — G20 Parkinson's disease: Secondary | ICD-10-CM | POA: Diagnosis not present

## 2022-01-03 DIAGNOSIS — F339 Major depressive disorder, recurrent, unspecified: Secondary | ICD-10-CM | POA: Diagnosis not present

## 2022-01-03 DIAGNOSIS — G20 Parkinson's disease: Secondary | ICD-10-CM | POA: Diagnosis not present

## 2022-01-03 DIAGNOSIS — R2243 Localized swelling, mass and lump, lower limb, bilateral: Secondary | ICD-10-CM | POA: Diagnosis not present

## 2022-01-04 DIAGNOSIS — G20 Parkinson's disease: Secondary | ICD-10-CM | POA: Diagnosis not present

## 2022-01-04 DIAGNOSIS — M6281 Muscle weakness (generalized): Secondary | ICD-10-CM | POA: Diagnosis not present

## 2022-01-04 DIAGNOSIS — F339 Major depressive disorder, recurrent, unspecified: Secondary | ICD-10-CM | POA: Diagnosis not present

## 2022-01-04 DIAGNOSIS — R2243 Localized swelling, mass and lump, lower limb, bilateral: Secondary | ICD-10-CM | POA: Diagnosis not present

## 2022-01-05 DIAGNOSIS — F339 Major depressive disorder, recurrent, unspecified: Secondary | ICD-10-CM | POA: Diagnosis not present

## 2022-01-05 DIAGNOSIS — R2243 Localized swelling, mass and lump, lower limb, bilateral: Secondary | ICD-10-CM | POA: Diagnosis not present

## 2022-01-05 DIAGNOSIS — D6852 Prothrombin gene mutation: Secondary | ICD-10-CM | POA: Diagnosis not present

## 2022-01-10 DIAGNOSIS — E039 Hypothyroidism, unspecified: Secondary | ICD-10-CM | POA: Diagnosis not present

## 2022-01-10 DIAGNOSIS — M6282 Rhabdomyolysis: Secondary | ICD-10-CM | POA: Diagnosis not present

## 2022-01-10 DIAGNOSIS — G20 Parkinson's disease: Secondary | ICD-10-CM | POA: Diagnosis not present

## 2022-01-10 DIAGNOSIS — F339 Major depressive disorder, recurrent, unspecified: Secondary | ICD-10-CM | POA: Diagnosis not present

## 2022-01-10 DIAGNOSIS — R2243 Localized swelling, mass and lump, lower limb, bilateral: Secondary | ICD-10-CM | POA: Diagnosis not present

## 2022-01-21 DIAGNOSIS — G20 Parkinson's disease: Secondary | ICD-10-CM | POA: Diagnosis not present

## 2022-01-21 DIAGNOSIS — I890 Lymphedema, not elsewhere classified: Secondary | ICD-10-CM | POA: Diagnosis not present

## 2022-01-21 DIAGNOSIS — R471 Dysarthria and anarthria: Secondary | ICD-10-CM | POA: Diagnosis not present

## 2022-01-21 DIAGNOSIS — F0280 Dementia in other diseases classified elsewhere without behavioral disturbance: Secondary | ICD-10-CM | POA: Diagnosis not present

## 2022-02-10 DIAGNOSIS — I890 Lymphedema, not elsewhere classified: Secondary | ICD-10-CM | POA: Diagnosis not present

## 2022-02-10 DIAGNOSIS — L89612 Pressure ulcer of right heel, stage 2: Secondary | ICD-10-CM | POA: Diagnosis not present

## 2022-02-17 DIAGNOSIS — L89612 Pressure ulcer of right heel, stage 2: Secondary | ICD-10-CM | POA: Diagnosis not present

## 2022-02-17 DIAGNOSIS — I890 Lymphedema, not elsewhere classified: Secondary | ICD-10-CM | POA: Diagnosis not present

## 2022-02-24 DIAGNOSIS — L89612 Pressure ulcer of right heel, stage 2: Secondary | ICD-10-CM | POA: Diagnosis not present

## 2022-02-24 DIAGNOSIS — I890 Lymphedema, not elsewhere classified: Secondary | ICD-10-CM | POA: Diagnosis not present

## 2022-03-09 DIAGNOSIS — I890 Lymphedema, not elsewhere classified: Secondary | ICD-10-CM | POA: Diagnosis not present

## 2022-03-09 DIAGNOSIS — L89612 Pressure ulcer of right heel, stage 2: Secondary | ICD-10-CM | POA: Diagnosis not present

## 2022-03-16 DIAGNOSIS — L89612 Pressure ulcer of right heel, stage 2: Secondary | ICD-10-CM | POA: Diagnosis not present

## 2022-03-16 DIAGNOSIS — I890 Lymphedema, not elsewhere classified: Secondary | ICD-10-CM | POA: Diagnosis not present

## 2022-03-24 DIAGNOSIS — I890 Lymphedema, not elsewhere classified: Secondary | ICD-10-CM | POA: Diagnosis not present

## 2022-03-24 DIAGNOSIS — L89612 Pressure ulcer of right heel, stage 2: Secondary | ICD-10-CM | POA: Diagnosis not present

## 2022-03-24 DIAGNOSIS — G20 Parkinson's disease: Secondary | ICD-10-CM | POA: Diagnosis not present

## 2022-03-31 DIAGNOSIS — G20 Parkinson's disease: Secondary | ICD-10-CM | POA: Diagnosis not present

## 2022-03-31 DIAGNOSIS — I890 Lymphedema, not elsewhere classified: Secondary | ICD-10-CM | POA: Diagnosis not present

## 2022-03-31 DIAGNOSIS — L89612 Pressure ulcer of right heel, stage 2: Secondary | ICD-10-CM | POA: Diagnosis not present

## 2022-04-12 DIAGNOSIS — G20 Parkinson's disease: Secondary | ICD-10-CM | POA: Diagnosis not present

## 2022-04-12 DIAGNOSIS — L03111 Cellulitis of right axilla: Secondary | ICD-10-CM | POA: Diagnosis not present

## 2022-04-12 DIAGNOSIS — F0280 Dementia in other diseases classified elsewhere without behavioral disturbance: Secondary | ICD-10-CM | POA: Diagnosis not present

## 2022-04-12 DIAGNOSIS — I890 Lymphedema, not elsewhere classified: Secondary | ICD-10-CM | POA: Diagnosis not present

## 2022-04-13 DIAGNOSIS — L03115 Cellulitis of right lower limb: Secondary | ICD-10-CM | POA: Diagnosis not present

## 2022-04-14 DIAGNOSIS — Z792 Long term (current) use of antibiotics: Secondary | ICD-10-CM | POA: Diagnosis not present

## 2022-04-14 DIAGNOSIS — I4811 Longstanding persistent atrial fibrillation: Secondary | ICD-10-CM | POA: Diagnosis not present

## 2022-04-14 DIAGNOSIS — L89152 Pressure ulcer of sacral region, stage 2: Secondary | ICD-10-CM | POA: Diagnosis not present

## 2022-04-14 DIAGNOSIS — Z8673 Personal history of transient ischemic attack (TIA), and cerebral infarction without residual deficits: Secondary | ICD-10-CM | POA: Diagnosis not present

## 2022-04-14 DIAGNOSIS — I749 Embolism and thrombosis of unspecified artery: Secondary | ICD-10-CM | POA: Diagnosis not present

## 2022-04-14 DIAGNOSIS — R54 Age-related physical debility: Secondary | ICD-10-CM | POA: Diagnosis not present

## 2022-04-14 DIAGNOSIS — L03312 Cellulitis of back [any part except buttock]: Secondary | ICD-10-CM | POA: Diagnosis not present

## 2022-04-14 DIAGNOSIS — F0283 Dementia in other diseases classified elsewhere, unspecified severity, with mood disturbance: Secondary | ICD-10-CM | POA: Diagnosis not present

## 2022-04-14 DIAGNOSIS — F39 Unspecified mood [affective] disorder: Secondary | ICD-10-CM | POA: Diagnosis not present

## 2022-04-14 DIAGNOSIS — E876 Hypokalemia: Secondary | ICD-10-CM | POA: Diagnosis not present

## 2022-04-14 DIAGNOSIS — L03115 Cellulitis of right lower limb: Secondary | ICD-10-CM | POA: Diagnosis not present

## 2022-04-14 DIAGNOSIS — I2699 Other pulmonary embolism without acute cor pulmonale: Secondary | ICD-10-CM | POA: Diagnosis not present

## 2022-04-14 DIAGNOSIS — G20 Parkinson's disease: Secondary | ICD-10-CM | POA: Diagnosis not present

## 2022-04-14 DIAGNOSIS — I63512 Cerebral infarction due to unspecified occlusion or stenosis of left middle cerebral artery: Secondary | ICD-10-CM | POA: Diagnosis not present

## 2022-04-14 DIAGNOSIS — I4892 Unspecified atrial flutter: Secondary | ICD-10-CM | POA: Diagnosis not present

## 2022-04-14 DIAGNOSIS — Z7901 Long term (current) use of anticoagulants: Secondary | ICD-10-CM | POA: Diagnosis not present

## 2022-04-14 DIAGNOSIS — M7989 Other specified soft tissue disorders: Secondary | ICD-10-CM | POA: Diagnosis not present

## 2022-04-14 DIAGNOSIS — Z79890 Hormone replacement therapy (postmenopausal): Secondary | ICD-10-CM | POA: Diagnosis not present

## 2022-04-14 DIAGNOSIS — I4819 Other persistent atrial fibrillation: Secondary | ICD-10-CM | POA: Diagnosis not present

## 2022-04-14 DIAGNOSIS — I2510 Atherosclerotic heart disease of native coronary artery without angina pectoris: Secondary | ICD-10-CM | POA: Diagnosis not present

## 2022-04-14 DIAGNOSIS — E039 Hypothyroidism, unspecified: Secondary | ICD-10-CM | POA: Diagnosis not present

## 2022-04-20 DIAGNOSIS — L03115 Cellulitis of right lower limb: Secondary | ICD-10-CM | POA: Diagnosis not present

## 2022-04-20 DIAGNOSIS — L89152 Pressure ulcer of sacral region, stage 2: Secondary | ICD-10-CM | POA: Diagnosis not present

## 2022-04-20 DIAGNOSIS — R54 Age-related physical debility: Secondary | ICD-10-CM | POA: Diagnosis not present

## 2022-04-21 DIAGNOSIS — L03111 Cellulitis of right axilla: Secondary | ICD-10-CM | POA: Diagnosis not present

## 2022-04-21 DIAGNOSIS — F0280 Dementia in other diseases classified elsewhere without behavioral disturbance: Secondary | ICD-10-CM | POA: Diagnosis not present

## 2022-04-21 DIAGNOSIS — G20 Parkinson's disease: Secondary | ICD-10-CM | POA: Diagnosis not present

## 2022-04-21 DIAGNOSIS — L89000 Pressure ulcer of unspecified elbow, unstageable: Secondary | ICD-10-CM | POA: Diagnosis not present

## 2022-05-05 DIAGNOSIS — I890 Lymphedema, not elsewhere classified: Secondary | ICD-10-CM | POA: Diagnosis not present

## 2022-05-05 DIAGNOSIS — G20 Parkinson's disease: Secondary | ICD-10-CM | POA: Diagnosis not present

## 2022-05-05 DIAGNOSIS — N182 Chronic kidney disease, stage 2 (mild): Secondary | ICD-10-CM | POA: Diagnosis not present

## 2022-05-05 DIAGNOSIS — F0280 Dementia in other diseases classified elsewhere without behavioral disturbance: Secondary | ICD-10-CM | POA: Diagnosis not present

## 2022-05-27 DIAGNOSIS — G20 Parkinson's disease: Secondary | ICD-10-CM | POA: Diagnosis not present

## 2022-05-27 DIAGNOSIS — I890 Lymphedema, not elsewhere classified: Secondary | ICD-10-CM | POA: Diagnosis not present

## 2022-05-27 DIAGNOSIS — N182 Chronic kidney disease, stage 2 (mild): Secondary | ICD-10-CM | POA: Diagnosis not present

## 2022-05-27 DIAGNOSIS — F0280 Dementia in other diseases classified elsewhere without behavioral disturbance: Secondary | ICD-10-CM | POA: Diagnosis not present

## 2022-06-16 DIAGNOSIS — Z7901 Long term (current) use of anticoagulants: Secondary | ICD-10-CM | POA: Diagnosis not present

## 2022-07-14 DIAGNOSIS — Z7901 Long term (current) use of anticoagulants: Secondary | ICD-10-CM | POA: Diagnosis not present

## 2022-08-03 DIAGNOSIS — G20 Parkinson's disease: Secondary | ICD-10-CM | POA: Diagnosis not present

## 2022-08-03 DIAGNOSIS — N182 Chronic kidney disease, stage 2 (mild): Secondary | ICD-10-CM | POA: Diagnosis not present

## 2022-08-03 DIAGNOSIS — M4800 Spinal stenosis, site unspecified: Secondary | ICD-10-CM | POA: Diagnosis not present

## 2022-08-03 DIAGNOSIS — F0390 Unspecified dementia without behavioral disturbance: Secondary | ICD-10-CM | POA: Diagnosis not present

## 2022-10-03 DIAGNOSIS — I872 Venous insufficiency (chronic) (peripheral): Secondary | ICD-10-CM | POA: Diagnosis not present

## 2022-10-03 DIAGNOSIS — M4800 Spinal stenosis, site unspecified: Secondary | ICD-10-CM | POA: Diagnosis not present

## 2022-10-03 DIAGNOSIS — N182 Chronic kidney disease, stage 2 (mild): Secondary | ICD-10-CM | POA: Diagnosis not present

## 2022-10-03 DIAGNOSIS — F0390 Unspecified dementia without behavioral disturbance: Secondary | ICD-10-CM | POA: Diagnosis not present

## 2022-11-17 DIAGNOSIS — Z7901 Long term (current) use of anticoagulants: Secondary | ICD-10-CM | POA: Diagnosis not present

## 2022-12-15 DIAGNOSIS — Z7901 Long term (current) use of anticoagulants: Secondary | ICD-10-CM | POA: Diagnosis not present

## 2022-12-15 DIAGNOSIS — Z130 Encounter for screening for diseases of the blood and blood-forming organs and certain disorders involving the immune mechanism: Secondary | ICD-10-CM | POA: Diagnosis not present

## 2022-12-21 DIAGNOSIS — N182 Chronic kidney disease, stage 2 (mild): Secondary | ICD-10-CM | POA: Diagnosis not present

## 2022-12-21 DIAGNOSIS — I890 Lymphedema, not elsewhere classified: Secondary | ICD-10-CM | POA: Diagnosis not present

## 2022-12-21 DIAGNOSIS — F339 Major depressive disorder, recurrent, unspecified: Secondary | ICD-10-CM | POA: Diagnosis not present

## 2022-12-21 DIAGNOSIS — F0280 Dementia in other diseases classified elsewhere without behavioral disturbance: Secondary | ICD-10-CM | POA: Diagnosis not present

## 2023-01-09 IMAGING — CR DG HIP (WITH OR WITHOUT PELVIS) 2-3V*R*
3 series · 3 of 3 positions shown · non-contrast
Comparison: None.

CLINICAL DATA: Fell 2 days ago.

EXAM:
DG HIP (WITH OR WITHOUT PELVIS) 2-3V RIGHT; DG HIP (WITH OR WITHOUT
PELVIS) 2-3V LEFT

[pelvis ap]
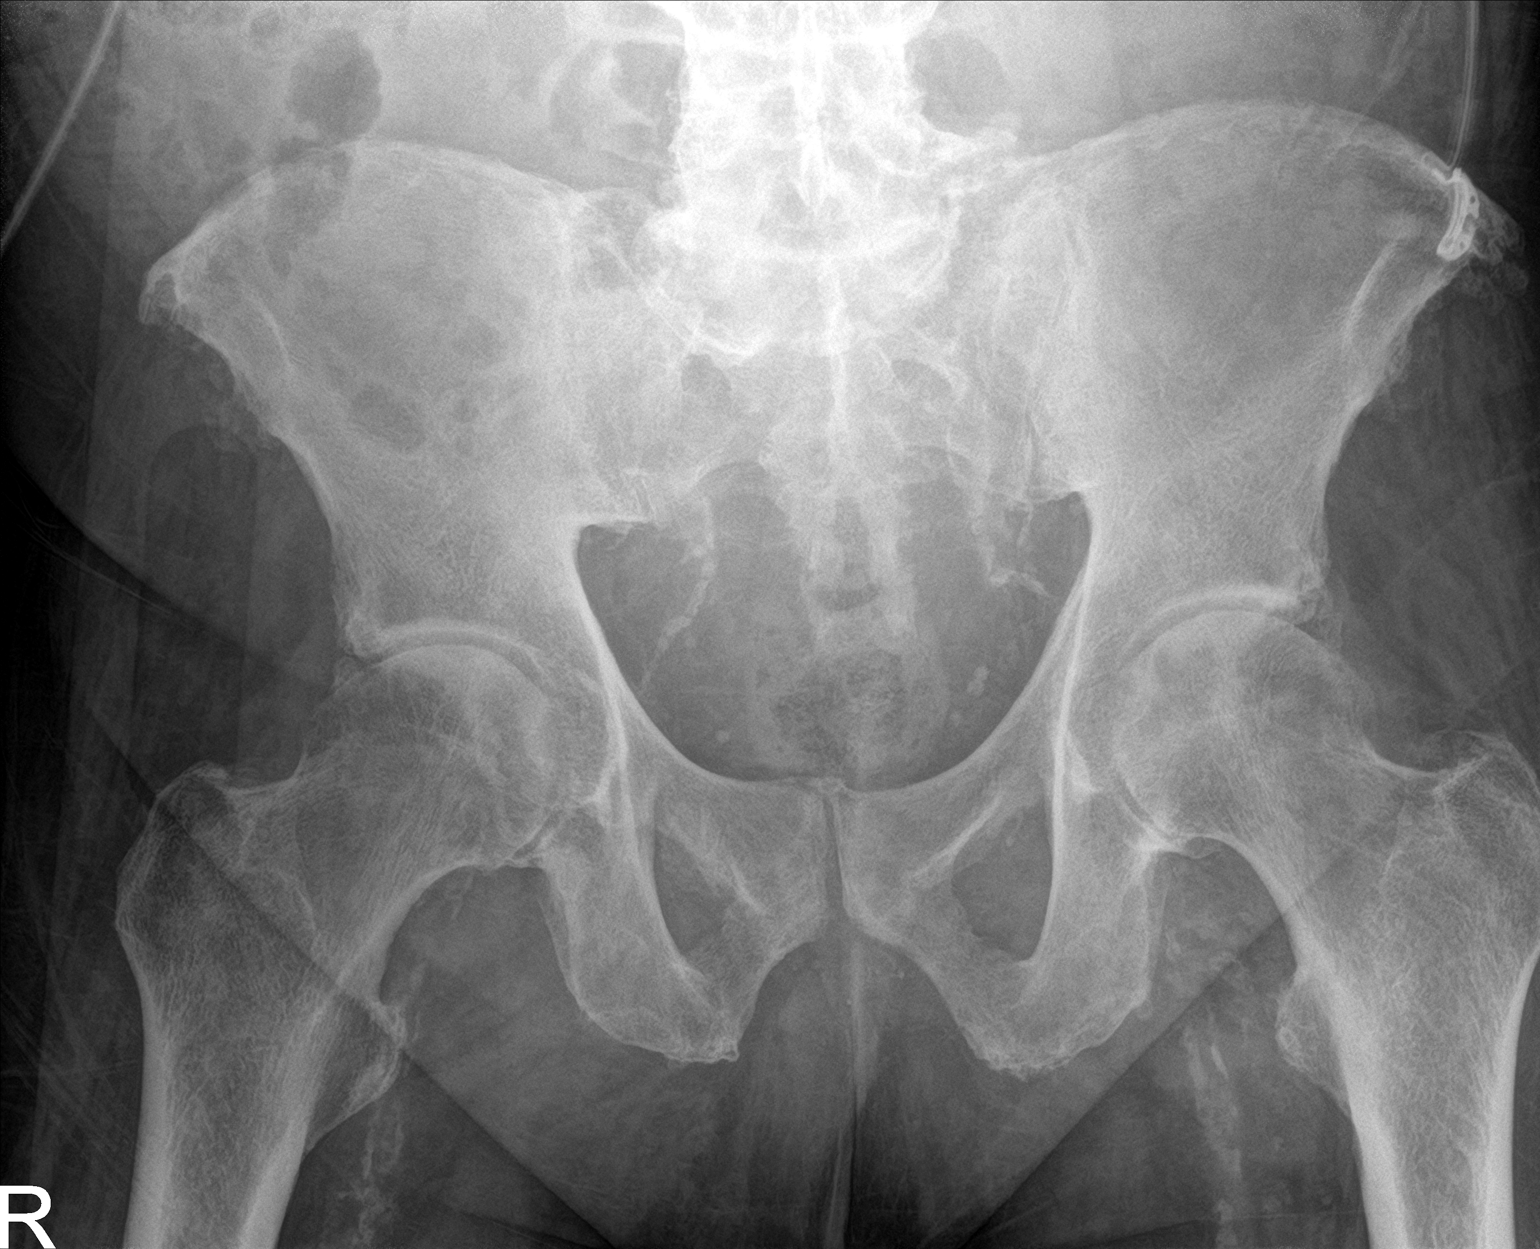

[hip ap]
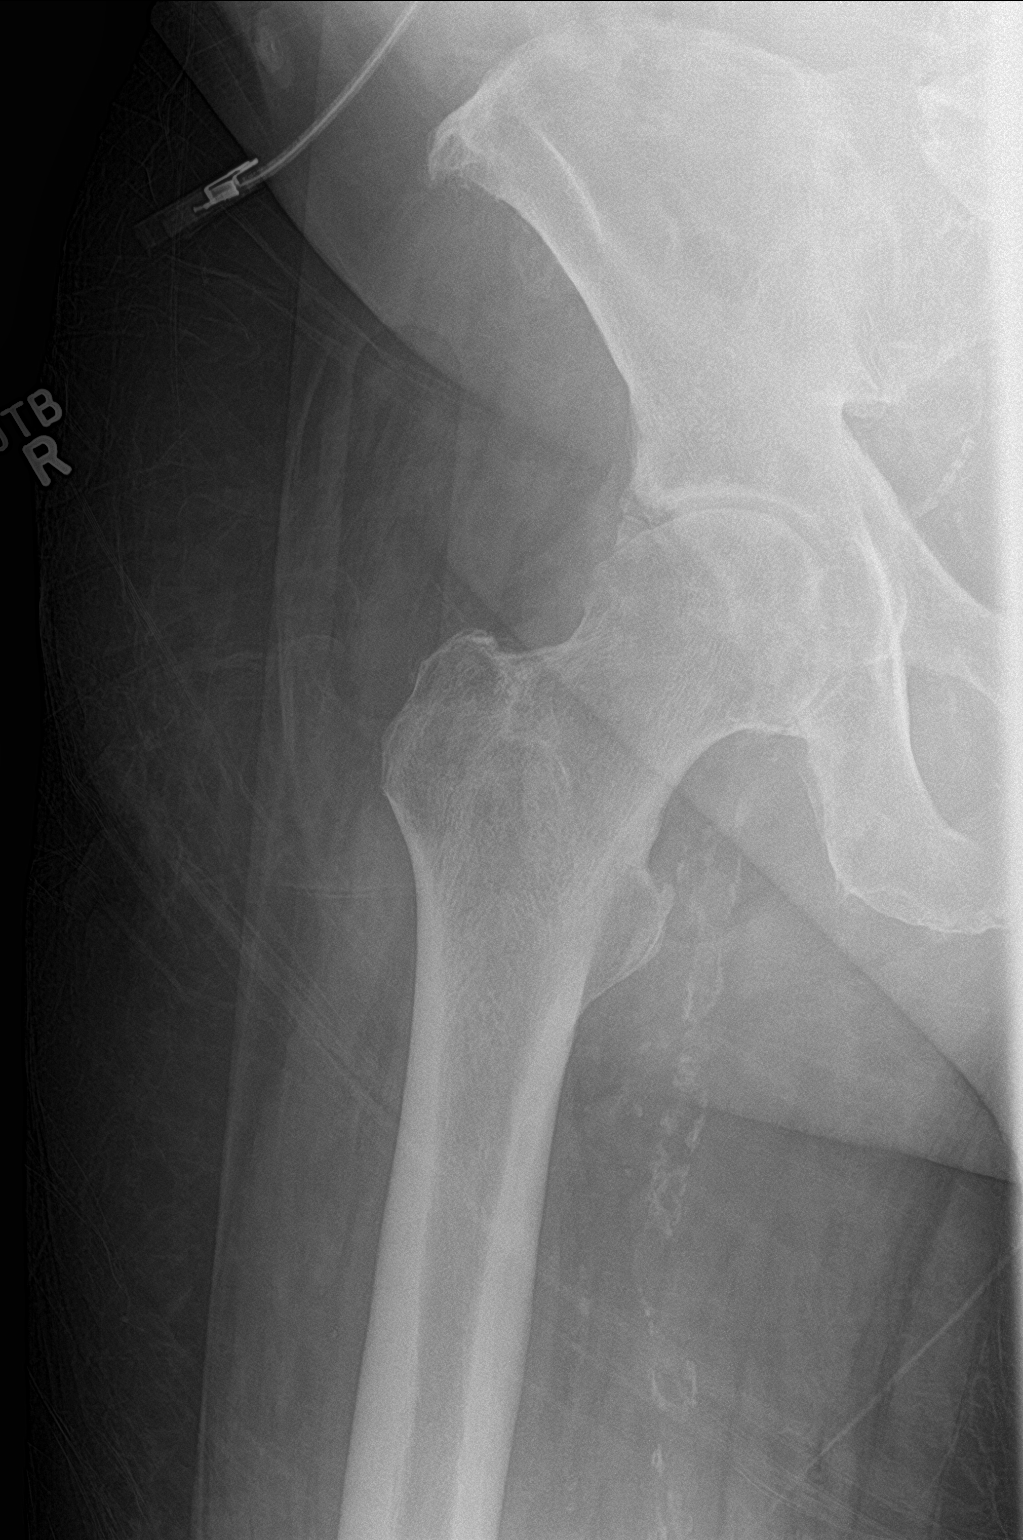

[hip lat]
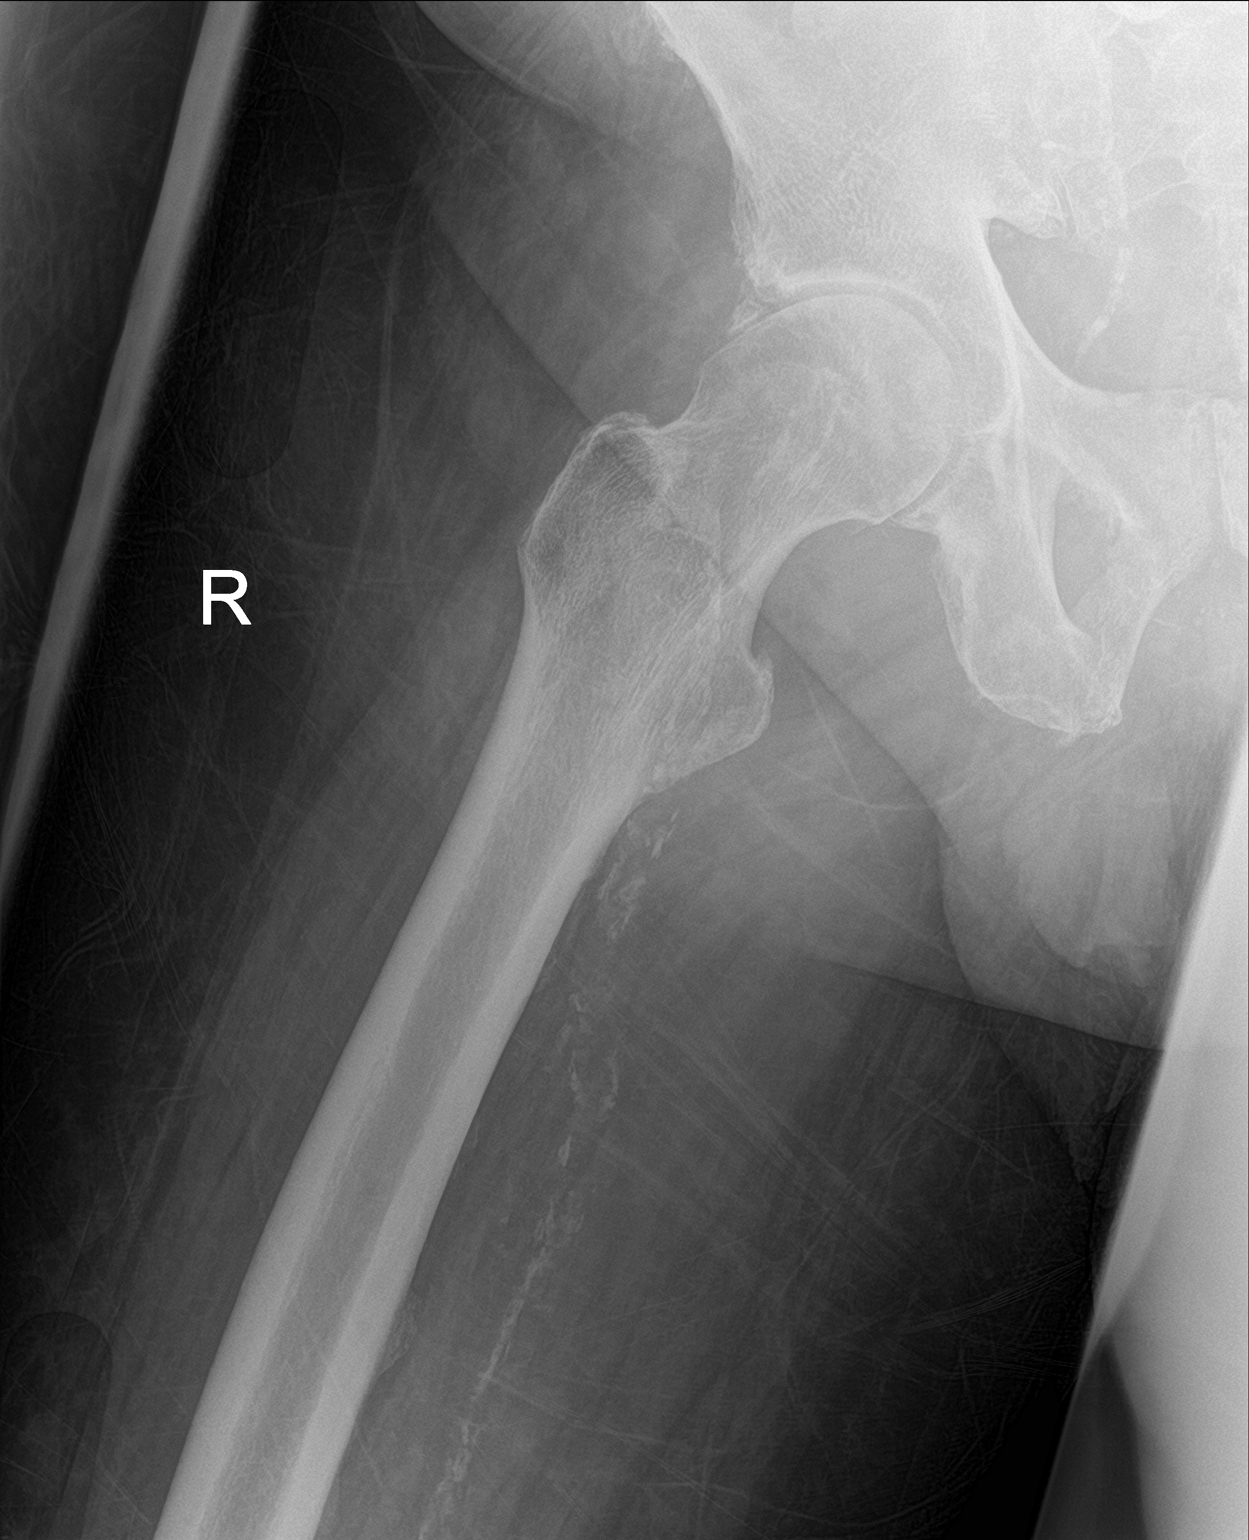

[3 of 3 positions shown; findings below may reference images not displayed]

FINDINGS: No acute fracture or dislocation. Mild bilateral hip osteoarthritis.
IMPRESSION: 1. No acute osseous abnormality.

## 2023-01-09 IMAGING — CR DG HIP (WITH OR WITHOUT PELVIS) 2-3V*L*
2 series · 2 of 2 positions shown · non-contrast
Comparison: None.

CLINICAL DATA: Fell 2 days ago.

EXAM:
DG HIP (WITH OR WITHOUT PELVIS) 2-3V RIGHT; DG HIP (WITH OR WITHOUT
PELVIS) 2-3V LEFT

[hip ap]
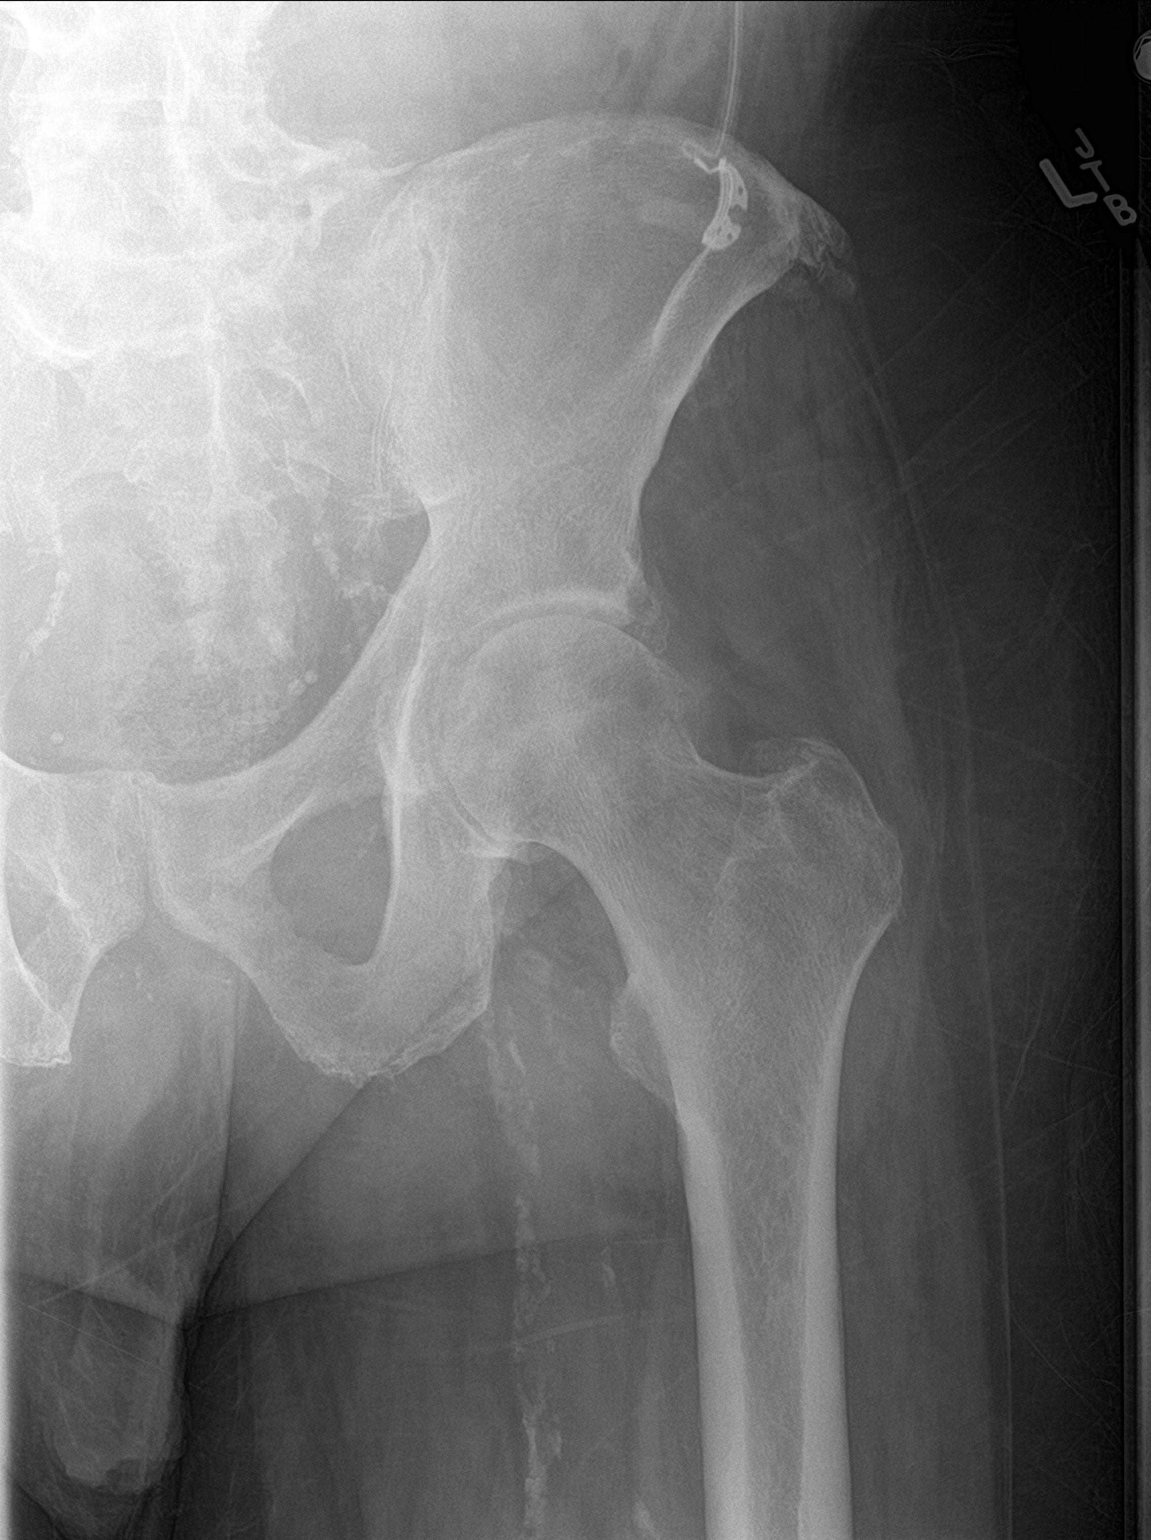

[hip lat]
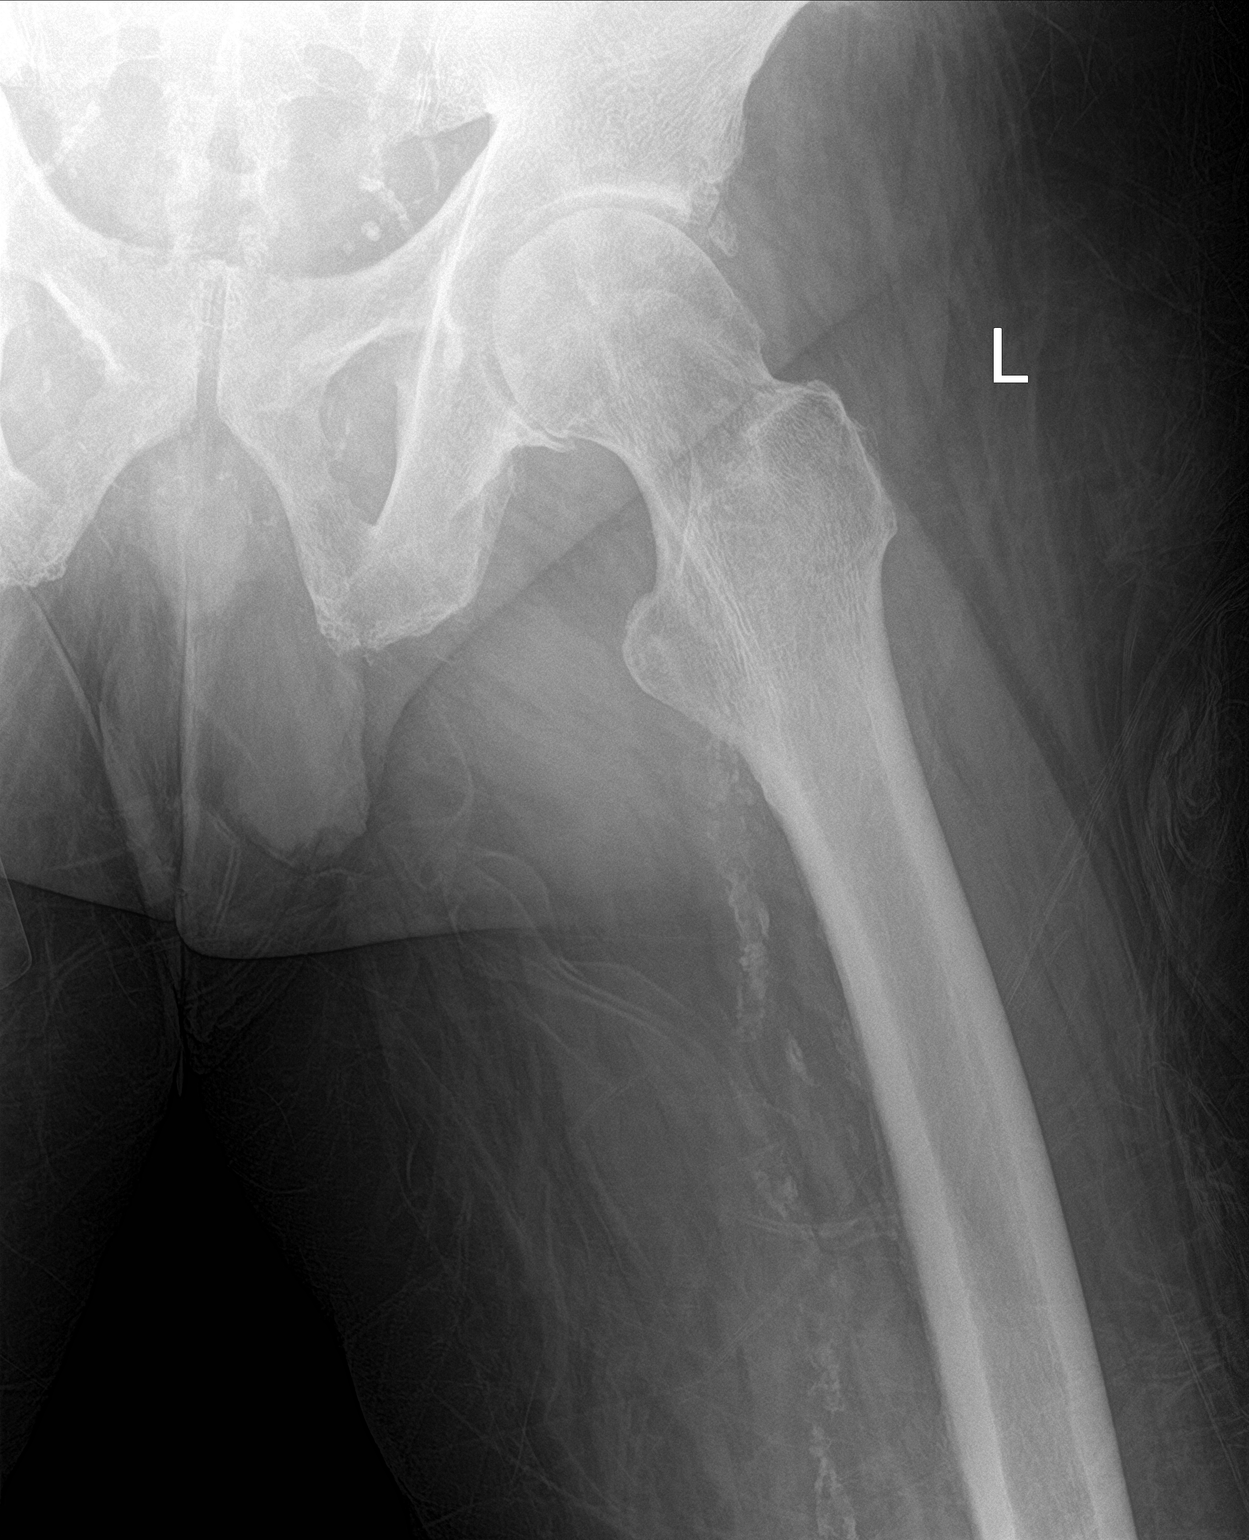

[2 of 2 positions shown; findings below may reference images not displayed]

FINDINGS: No acute fracture or dislocation. Mild bilateral hip osteoarthritis.
IMPRESSION: 1. No acute osseous abnormality.

## 2023-01-17 DIAGNOSIS — F339 Major depressive disorder, recurrent, unspecified: Secondary | ICD-10-CM | POA: Diagnosis not present

## 2023-01-17 DIAGNOSIS — N182 Chronic kidney disease, stage 2 (mild): Secondary | ICD-10-CM | POA: Diagnosis not present

## 2023-01-17 DIAGNOSIS — F0280 Dementia in other diseases classified elsewhere without behavioral disturbance: Secondary | ICD-10-CM | POA: Diagnosis not present

## 2023-01-17 DIAGNOSIS — G20C Parkinsonism, unspecified: Secondary | ICD-10-CM | POA: Diagnosis not present

## 2023-03-02 DIAGNOSIS — G20C Parkinsonism, unspecified: Secondary | ICD-10-CM | POA: Diagnosis not present

## 2023-03-02 DIAGNOSIS — I890 Lymphedema, not elsewhere classified: Secondary | ICD-10-CM | POA: Diagnosis not present

## 2023-03-02 DIAGNOSIS — N182 Chronic kidney disease, stage 2 (mild): Secondary | ICD-10-CM | POA: Diagnosis not present

## 2023-03-02 DIAGNOSIS — F0390 Unspecified dementia without behavioral disturbance: Secondary | ICD-10-CM | POA: Diagnosis not present

## 2023-05-17 DIAGNOSIS — N182 Chronic kidney disease, stage 2 (mild): Secondary | ICD-10-CM | POA: Diagnosis not present

## 2023-05-17 DIAGNOSIS — G20C Parkinsonism, unspecified: Secondary | ICD-10-CM | POA: Diagnosis not present

## 2023-05-17 DIAGNOSIS — F339 Major depressive disorder, recurrent, unspecified: Secondary | ICD-10-CM | POA: Diagnosis not present

## 2023-05-17 DIAGNOSIS — I890 Lymphedema, not elsewhere classified: Secondary | ICD-10-CM | POA: Diagnosis not present

## 2023-06-29 DIAGNOSIS — I890 Lymphedema, not elsewhere classified: Secondary | ICD-10-CM | POA: Diagnosis not present

## 2023-06-29 DIAGNOSIS — I872 Venous insufficiency (chronic) (peripheral): Secondary | ICD-10-CM | POA: Diagnosis not present

## 2023-06-29 DIAGNOSIS — N182 Chronic kidney disease, stage 2 (mild): Secondary | ICD-10-CM | POA: Diagnosis not present

## 2023-06-29 DIAGNOSIS — R471 Dysarthria and anarthria: Secondary | ICD-10-CM | POA: Diagnosis not present

## 2023-08-30 DIAGNOSIS — R4182 Altered mental status, unspecified: Secondary | ICD-10-CM | POA: Diagnosis not present

## 2023-08-30 DIAGNOSIS — J9811 Atelectasis: Secondary | ICD-10-CM | POA: Diagnosis not present

## 2023-08-30 DIAGNOSIS — Z515 Encounter for palliative care: Secondary | ICD-10-CM | POA: Diagnosis not present

## 2023-08-31 DIAGNOSIS — R55 Syncope and collapse: Secondary | ICD-10-CM | POA: Diagnosis not present

## 2023-08-31 DIAGNOSIS — I082 Rheumatic disorders of both aortic and tricuspid valves: Secondary | ICD-10-CM | POA: Diagnosis not present

## 2023-08-31 DIAGNOSIS — R001 Bradycardia, unspecified: Secondary | ICD-10-CM | POA: Diagnosis not present

## 2023-08-31 DIAGNOSIS — I4892 Unspecified atrial flutter: Secondary | ICD-10-CM | POA: Diagnosis not present

## 2023-08-31 DIAGNOSIS — Z515 Encounter for palliative care: Secondary | ICD-10-CM | POA: Diagnosis not present

## 2023-09-01 DIAGNOSIS — Z515 Encounter for palliative care: Secondary | ICD-10-CM | POA: Diagnosis not present

## 2023-09-02 DIAGNOSIS — Z515 Encounter for palliative care: Secondary | ICD-10-CM | POA: Diagnosis not present

## 2023-09-03 DIAGNOSIS — Z515 Encounter for palliative care: Secondary | ICD-10-CM | POA: Diagnosis not present

## 2023-09-04 DIAGNOSIS — Z515 Encounter for palliative care: Secondary | ICD-10-CM | POA: Diagnosis not present

## 2023-09-05 DIAGNOSIS — G9341 Metabolic encephalopathy: Secondary | ICD-10-CM | POA: Diagnosis not present

## 2023-09-05 DIAGNOSIS — M4800 Spinal stenosis, site unspecified: Secondary | ICD-10-CM | POA: Diagnosis not present

## 2023-09-05 DIAGNOSIS — F339 Major depressive disorder, recurrent, unspecified: Secondary | ICD-10-CM | POA: Diagnosis not present

## 2023-09-05 DIAGNOSIS — R55 Syncope and collapse: Secondary | ICD-10-CM | POA: Diagnosis not present

## 2023-09-20 DIAGNOSIS — R55 Syncope and collapse: Secondary | ICD-10-CM | POA: Diagnosis not present

## 2023-10-03 DIAGNOSIS — G20C Parkinsonism, unspecified: Secondary | ICD-10-CM | POA: Diagnosis not present

## 2023-10-03 DIAGNOSIS — F0390 Unspecified dementia without behavioral disturbance: Secondary | ICD-10-CM | POA: Diagnosis not present

## 2023-10-03 DIAGNOSIS — R55 Syncope and collapse: Secondary | ICD-10-CM | POA: Diagnosis not present

## 2023-10-03 DIAGNOSIS — M4800 Spinal stenosis, site unspecified: Secondary | ICD-10-CM | POA: Diagnosis not present

## 2023-10-26 DIAGNOSIS — I4891 Unspecified atrial fibrillation: Secondary | ICD-10-CM | POA: Diagnosis not present

## 2023-10-26 DIAGNOSIS — I4510 Unspecified right bundle-branch block: Secondary | ICD-10-CM | POA: Diagnosis not present

## 2023-10-26 DIAGNOSIS — R1110 Vomiting, unspecified: Secondary | ICD-10-CM | POA: Diagnosis not present

## 2023-10-26 DIAGNOSIS — F339 Major depressive disorder, recurrent, unspecified: Secondary | ICD-10-CM | POA: Diagnosis not present

## 2023-10-26 DIAGNOSIS — R4182 Altered mental status, unspecified: Secondary | ICD-10-CM | POA: Diagnosis not present

## 2023-10-26 DIAGNOSIS — J9811 Atelectasis: Secondary | ICD-10-CM | POA: Diagnosis not present

## 2023-10-26 DIAGNOSIS — J986 Disorders of diaphragm: Secondary | ICD-10-CM | POA: Diagnosis not present

## 2023-10-26 DIAGNOSIS — R55 Syncope and collapse: Secondary | ICD-10-CM | POA: Diagnosis not present

## 2023-10-26 DIAGNOSIS — G9341 Metabolic encephalopathy: Secondary | ICD-10-CM | POA: Diagnosis not present

## 2023-10-27 DIAGNOSIS — M4800 Spinal stenosis, site unspecified: Secondary | ICD-10-CM | POA: Diagnosis not present

## 2023-10-27 DIAGNOSIS — I872 Venous insufficiency (chronic) (peripheral): Secondary | ICD-10-CM | POA: Diagnosis not present

## 2023-10-27 DIAGNOSIS — G9341 Metabolic encephalopathy: Secondary | ICD-10-CM | POA: Diagnosis not present

## 2023-10-27 DIAGNOSIS — R55 Syncope and collapse: Secondary | ICD-10-CM | POA: Diagnosis not present

## 2024-01-08 DIAGNOSIS — R55 Syncope and collapse: Secondary | ICD-10-CM | POA: Diagnosis not present

## 2024-01-08 DIAGNOSIS — F339 Major depressive disorder, recurrent, unspecified: Secondary | ICD-10-CM | POA: Diagnosis not present

## 2024-01-08 DIAGNOSIS — N182 Chronic kidney disease, stage 2 (mild): Secondary | ICD-10-CM | POA: Diagnosis not present

## 2024-01-08 DIAGNOSIS — F0280 Dementia in other diseases classified elsewhere without behavioral disturbance: Secondary | ICD-10-CM | POA: Diagnosis not present

## 2024-01-30 DIAGNOSIS — R55 Syncope and collapse: Secondary | ICD-10-CM | POA: Diagnosis not present

## 2024-01-30 DIAGNOSIS — F0390 Unspecified dementia without behavioral disturbance: Secondary | ICD-10-CM | POA: Diagnosis not present

## 2024-01-30 DIAGNOSIS — M4800 Spinal stenosis, site unspecified: Secondary | ICD-10-CM | POA: Diagnosis not present

## 2024-01-30 DIAGNOSIS — G20C Parkinsonism, unspecified: Secondary | ICD-10-CM | POA: Diagnosis not present

## 2024-01-31 DIAGNOSIS — G9341 Metabolic encephalopathy: Secondary | ICD-10-CM | POA: Diagnosis not present

## 2024-01-31 DIAGNOSIS — G20C Parkinsonism, unspecified: Secondary | ICD-10-CM | POA: Diagnosis not present

## 2024-01-31 DIAGNOSIS — H6123 Impacted cerumen, bilateral: Secondary | ICD-10-CM | POA: Diagnosis not present

## 2024-01-31 DIAGNOSIS — F0390 Unspecified dementia without behavioral disturbance: Secondary | ICD-10-CM | POA: Diagnosis not present

## 2024-02-20 DIAGNOSIS — E039 Hypothyroidism, unspecified: Secondary | ICD-10-CM | POA: Diagnosis not present

## 2024-02-20 DIAGNOSIS — F0280 Dementia in other diseases classified elsewhere without behavioral disturbance: Secondary | ICD-10-CM | POA: Diagnosis not present

## 2024-02-20 DIAGNOSIS — N182 Chronic kidney disease, stage 2 (mild): Secondary | ICD-10-CM | POA: Diagnosis not present

## 2024-02-20 DIAGNOSIS — G20C Parkinsonism, unspecified: Secondary | ICD-10-CM | POA: Diagnosis not present

## 2024-04-22 DIAGNOSIS — G20C Parkinsonism, unspecified: Secondary | ICD-10-CM | POA: Diagnosis not present

## 2024-04-22 DIAGNOSIS — E039 Hypothyroidism, unspecified: Secondary | ICD-10-CM | POA: Diagnosis not present

## 2024-04-22 DIAGNOSIS — N182 Chronic kidney disease, stage 2 (mild): Secondary | ICD-10-CM | POA: Diagnosis not present

## 2024-04-22 DIAGNOSIS — L03031 Cellulitis of right toe: Secondary | ICD-10-CM | POA: Diagnosis not present

## 2024-06-21 DIAGNOSIS — E039 Hypothyroidism, unspecified: Secondary | ICD-10-CM | POA: Diagnosis not present

## 2024-06-21 DIAGNOSIS — G20C Parkinsonism, unspecified: Secondary | ICD-10-CM | POA: Diagnosis not present

## 2024-06-21 DIAGNOSIS — N182 Chronic kidney disease, stage 2 (mild): Secondary | ICD-10-CM | POA: Diagnosis not present

## 2024-06-21 DIAGNOSIS — R54 Age-related physical debility: Secondary | ICD-10-CM | POA: Diagnosis not present

## 2024-08-26 DIAGNOSIS — K5901 Slow transit constipation: Secondary | ICD-10-CM | POA: Diagnosis not present

## 2024-08-28 DIAGNOSIS — G20C Parkinsonism, unspecified: Secondary | ICD-10-CM | POA: Diagnosis not present

## 2024-08-28 DIAGNOSIS — F0280 Dementia in other diseases classified elsewhere without behavioral disturbance: Secondary | ICD-10-CM | POA: Diagnosis not present

## 2024-08-28 DIAGNOSIS — N182 Chronic kidney disease, stage 2 (mild): Secondary | ICD-10-CM | POA: Diagnosis not present

## 2024-08-28 DIAGNOSIS — E039 Hypothyroidism, unspecified: Secondary | ICD-10-CM | POA: Diagnosis not present

## 2024-09-10 DIAGNOSIS — E039 Hypothyroidism, unspecified: Secondary | ICD-10-CM | POA: Diagnosis not present

## 2024-09-10 DIAGNOSIS — N182 Chronic kidney disease, stage 2 (mild): Secondary | ICD-10-CM | POA: Diagnosis not present

## 2024-09-10 DIAGNOSIS — R051 Acute cough: Secondary | ICD-10-CM | POA: Diagnosis not present

## 2024-09-10 DIAGNOSIS — G20C Parkinsonism, unspecified: Secondary | ICD-10-CM | POA: Diagnosis not present
